# Patient Record
Sex: Female | Born: 1935 | Race: White | Hispanic: No
Health system: Southern US, Community
[De-identification: ages and names within clinical notes are randomized; demographics above are authoritative.]

---

## 2004-06-24 ENCOUNTER — Ambulatory Visit: Payer: Self-pay | Admitting: Unknown Physician Specialty

## 2004-12-24 ENCOUNTER — Ambulatory Visit: Payer: Self-pay | Admitting: Family Medicine

## 2005-06-11 ENCOUNTER — Ambulatory Visit: Payer: Self-pay | Admitting: Gastroenterology

## 2006-03-12 ENCOUNTER — Ambulatory Visit: Payer: Self-pay | Admitting: Family Medicine

## 2007-06-24 ENCOUNTER — Ambulatory Visit: Payer: Self-pay | Admitting: Family Medicine

## 2009-03-30 ENCOUNTER — Inpatient Hospital Stay: Payer: Self-pay | Admitting: Internal Medicine

## 2010-06-23 ENCOUNTER — Inpatient Hospital Stay: Payer: Self-pay | Admitting: Internal Medicine

## 2011-04-17 ENCOUNTER — Inpatient Hospital Stay: Payer: Self-pay | Admitting: Internal Medicine

## 2011-07-30 LAB — COMPREHENSIVE METABOLIC PANEL
Albumin: 3.3 g/dL — ABNORMAL LOW (ref 3.4–5.0)
Alkaline Phosphatase: 72 U/L (ref 50–136)
Anion Gap: 14 (ref 7–16)
Bilirubin,Total: 0.3 mg/dL (ref 0.2–1.0)
Calcium, Total: 8.4 mg/dL — ABNORMAL LOW (ref 8.5–10.1)
Chloride: 104 mmol/L (ref 98–107)
Co2: 23 mmol/L (ref 21–32)
Creatinine: 1.25 mg/dL (ref 0.60–1.30)
EGFR (Non-African Amer.): 44 — ABNORMAL LOW
Osmolality: 289 (ref 275–301)
Potassium: 4.1 mmol/L (ref 3.5–5.1)
SGOT(AST): 13 U/L — ABNORMAL LOW (ref 15–37)
Sodium: 141 mmol/L (ref 136–145)

## 2011-07-30 LAB — CBC
HCT: 34.9 % — ABNORMAL LOW (ref 35.0–47.0)
HGB: 11.4 g/dL — ABNORMAL LOW (ref 12.0–16.0)
MCH: 26.3 pg (ref 26.0–34.0)
MCHC: 32.8 g/dL (ref 32.0–36.0)
MCV: 80 fL (ref 80–100)
RBC: 4.36 10*6/uL (ref 3.80–5.20)
RDW: 16.2 % — ABNORMAL HIGH (ref 11.5–14.5)

## 2011-07-30 LAB — URINALYSIS, COMPLETE
Bacteria: NONE SEEN
Bilirubin,UR: NEGATIVE
Glucose,UR: NEGATIVE mg/dL (ref 0–75)
Ph: 6 (ref 4.5–8.0)
Protein: NEGATIVE
RBC,UR: 2 /HPF (ref 0–5)
Squamous Epithelial: NONE SEEN
WBC UR: 1 /HPF (ref 0–5)

## 2011-07-31 ENCOUNTER — Inpatient Hospital Stay: Payer: Self-pay | Admitting: Internal Medicine

## 2011-07-31 LAB — CK TOTAL AND CKMB (NOT AT ARMC): CK, Total: 25 U/L (ref 21–215)

## 2011-07-31 LAB — TSH: Thyroid Stimulating Horm: 6.28 u[IU]/mL — ABNORMAL HIGH

## 2011-07-31 LAB — RAPID INFLUENZA A&B ANTIGENS

## 2011-07-31 LAB — TROPONIN I: Troponin-I: <0.02 ng/mL

## 2011-08-01 LAB — BASIC METABOLIC PANEL
Anion Gap: 14 (ref 7–16)
Calcium, Total: 7.9 mg/dL — ABNORMAL LOW (ref 8.5–10.1)
Chloride: 108 mmol/L — ABNORMAL HIGH (ref 98–107)
EGFR (Non-African Amer.): 50 — ABNORMAL LOW
Osmolality: 289 (ref 275–301)
Potassium: 3.7 mmol/L (ref 3.5–5.1)
Sodium: 142 mmol/L (ref 136–145)

## 2011-08-01 LAB — URINE CULTURE

## 2011-08-02 LAB — CBC WITH DIFFERENTIAL/PLATELET
Basophil #: 0 10*3/uL (ref 0.0–0.1)
Eosinophil %: 0 %
HCT: 32.4 % — ABNORMAL LOW (ref 35.0–47.0)
HGB: 10.6 g/dL — ABNORMAL LOW (ref 12.0–16.0)
Lymphocyte #: 0.3 10*3/uL — ABNORMAL LOW (ref 1.0–3.6)
Lymphocyte %: 11.7 %
MCH: 26.3 pg (ref 26.0–34.0)
MCV: 80 fL (ref 80–100)
Monocyte %: 9.7 %
Platelet: 107 10*3/uL — ABNORMAL LOW (ref 150–440)
RBC: 4.05 10*6/uL (ref 3.80–5.20)
RDW: 16.6 % — ABNORMAL HIGH (ref 11.5–14.5)

## 2011-08-02 LAB — URINALYSIS, COMPLETE
Bacteria: NONE SEEN
Bilirubin,UR: NEGATIVE
Ketone: NEGATIVE
Ph: 5 (ref 4.5–8.0)
RBC,UR: 1 /HPF (ref 0–5)
Specific Gravity: 1.029 (ref 1.003–1.030)
Squamous Epithelial: NONE SEEN

## 2011-08-02 LAB — BASIC METABOLIC PANEL
Anion Gap: 13 (ref 7–16)
BUN: 25 mg/dL — ABNORMAL HIGH (ref 7–18)
Chloride: 106 mmol/L (ref 98–107)
Co2: 23 mmol/L (ref 21–32)
Creatinine: 1.17 mg/dL (ref 0.60–1.30)
EGFR (Non-African Amer.): 47 — ABNORMAL LOW
Osmolality: 291 (ref 275–301)
Potassium: 3.7 mmol/L (ref 3.5–5.1)
Sodium: 142 mmol/L (ref 136–145)

## 2011-08-03 LAB — CBC WITH DIFFERENTIAL/PLATELET
Basophil #: 0 10*3/uL (ref 0.0–0.1)
Basophil %: 0.3 %
Eosinophil %: 0.6 %
HCT: 33.2 % — ABNORMAL LOW (ref 35.0–47.0)
HGB: 10.9 g/dL — ABNORMAL LOW (ref 12.0–16.0)
Lymphocyte #: 0.7 10*3/uL — ABNORMAL LOW (ref 1.0–3.6)
Lymphocyte %: 18.8 %
MCV: 80 fL (ref 80–100)
Monocyte #: 0.4 10*3/uL (ref 0.0–0.7)
Monocyte %: 10.2 %
Neutrophil #: 2.5 10*3/uL (ref 1.4–6.5)
RBC: 4.15 10*6/uL (ref 3.80–5.20)
RDW: 16.5 % — ABNORMAL HIGH (ref 11.5–14.5)

## 2011-08-04 LAB — BASIC METABOLIC PANEL
BUN: 35 mg/dL — ABNORMAL HIGH (ref 7–18)
Calcium, Total: 7.9 mg/dL — ABNORMAL LOW (ref 8.5–10.1)
Creatinine: 0.97 mg/dL (ref 0.60–1.30)
EGFR (African American): >60
EGFR (Non-African Amer.): 59 — ABNORMAL LOW
Glucose: 121 mg/dL — ABNORMAL HIGH (ref 65–99)
Potassium: 3.8 mmol/L (ref 3.5–5.1)
Sodium: 143 mmol/L (ref 136–145)

## 2011-08-04 LAB — HEMOGLOBIN A1C: Hemoglobin A1C: 6.2 % (ref 4.2–6.3)

## 2011-08-04 LAB — URINE CULTURE

## 2011-08-05 LAB — CULTURE, BLOOD (SINGLE)

## 2011-08-07 LAB — CULTURE, BLOOD (SINGLE)

## 2011-11-06 ENCOUNTER — Inpatient Hospital Stay: Payer: Self-pay | Admitting: Internal Medicine

## 2011-11-06 LAB — COMPREHENSIVE METABOLIC PANEL
Alkaline Phosphatase: 99 U/L (ref 50–136)
Anion Gap: 10 (ref 7–16)
BUN: 21 mg/dL — ABNORMAL HIGH (ref 7–18)
Bilirubin,Total: 0.4 mg/dL (ref 0.2–1.0)
Calcium, Total: 8.7 mg/dL (ref 8.5–10.1)
Chloride: 102 mmol/L (ref 98–107)
Co2: 26 mmol/L (ref 21–32)
EGFR (African American): >60
EGFR (Non-African Amer.): >60
Potassium: 3.9 mmol/L (ref 3.5–5.1)
Sodium: 138 mmol/L (ref 136–145)

## 2011-11-06 LAB — CBC
MCH: 27.7 pg (ref 26.0–34.0)
Platelet: 184 10*3/uL (ref 150–440)
WBC: 9.8 10*3/uL (ref 3.6–11.0)

## 2011-11-06 LAB — URINALYSIS, COMPLETE
Bilirubin,UR: NEGATIVE
Nitrite: NEGATIVE
Protein: NEGATIVE
RBC,UR: 2 /HPF (ref 0–5)
Specific Gravity: 1.018 (ref 1.003–1.030)
Squamous Epithelial: 1

## 2011-11-06 LAB — TROPONIN I: Troponin-I: <0.02 ng/mL

## 2011-11-07 LAB — CBC WITH DIFFERENTIAL/PLATELET
Basophil #: 0 10*3/uL (ref 0.0–0.1)
Basophil %: 0.2 %
Eosinophil #: 0 10*3/uL (ref 0.0–0.7)
Eosinophil %: 0 %
HCT: 36.8 % (ref 35.0–47.0)
HGB: 12.3 g/dL (ref 12.0–16.0)
Lymphocyte %: 10.3 %
MCH: 28.2 pg (ref 26.0–34.0)
MCHC: 33.5 g/dL (ref 32.0–36.0)
MCV: 84 fL (ref 80–100)
Monocyte #: 0.7 x10 3/mm (ref 0.2–0.9)
Neutrophil #: 5.8 10*3/uL (ref 1.4–6.5)
Neutrophil %: 79.7 %
Platelet: 158 10*3/uL (ref 150–440)
RBC: 4.36 10*6/uL (ref 3.80–5.20)
RDW: 17.9 % — ABNORMAL HIGH (ref 11.5–14.5)
WBC: 7.3 10*3/uL (ref 3.6–11.0)

## 2011-11-07 LAB — BASIC METABOLIC PANEL
Anion Gap: 10 (ref 7–16)
BUN: 17 mg/dL (ref 7–18)
Calcium, Total: 8.1 mg/dL — ABNORMAL LOW (ref 8.5–10.1)
Co2: 25 mmol/L (ref 21–32)
Creatinine: 0.86 mg/dL (ref 0.60–1.30)
EGFR (African American): >60
EGFR (Non-African Amer.): >60
Glucose: 116 mg/dL — ABNORMAL HIGH (ref 65–99)
Osmolality: 280 (ref 275–301)
Potassium: 3.7 mmol/L (ref 3.5–5.1)
Sodium: 139 mmol/L (ref 136–145)

## 2011-11-12 LAB — CULTURE, BLOOD (SINGLE)

## 2011-12-03 ENCOUNTER — Observation Stay: Payer: Self-pay | Admitting: Internal Medicine

## 2011-12-03 LAB — COMPREHENSIVE METABOLIC PANEL
Albumin: 3.2 g/dL — ABNORMAL LOW (ref 3.4–5.0)
Alkaline Phosphatase: 90 U/L (ref 50–136)
Anion Gap: 7 (ref 7–16)
BUN: 20 mg/dL — ABNORMAL HIGH (ref 7–18)
Bilirubin,Total: 0.7 mg/dL (ref 0.2–1.0)
Calcium, Total: 8.8 mg/dL (ref 8.5–10.1)
Chloride: 103 mmol/L (ref 98–107)
Co2: 30 mmol/L (ref 21–32)
Creatinine: 1.02 mg/dL (ref 0.60–1.30)
EGFR (African American): >60
EGFR (Non-African Amer.): 52 — ABNORMAL LOW
Glucose: 118 mg/dL — ABNORMAL HIGH (ref 65–99)
Osmolality: 283 (ref 275–301)
Potassium: 4.1 mmol/L (ref 3.5–5.1)
SGOT(AST): 18 U/L (ref 15–37)
SGPT (ALT): 16 U/L
Sodium: 140 mmol/L (ref 136–145)
Total Protein: 7.5 g/dL (ref 6.4–8.2)

## 2011-12-03 LAB — URINALYSIS, COMPLETE
Bacteria: NONE SEEN
Bilirubin,UR: NEGATIVE
Blood: NEGATIVE
Glucose,UR: NEGATIVE mg/dL (ref 0–75)
Ketone: NEGATIVE
Leukocyte Esterase: NEGATIVE
Nitrite: NEGATIVE
Ph: 7 (ref 4.5–8.0)
Protein: NEGATIVE
RBC,UR: 1 /HPF (ref 0–5)
Specific Gravity: 1.023 (ref 1.003–1.030)
Squamous Epithelial: NONE SEEN
WBC UR: <1 /HPF (ref 0–5)

## 2011-12-03 LAB — CBC
HCT: 41.5 % (ref 35.0–47.0)
HGB: 13.7 g/dL (ref 12.0–16.0)
MCH: 28.6 pg (ref 26.0–34.0)
MCHC: 32.9 g/dL (ref 32.0–36.0)
MCV: 87 fL (ref 80–100)
Platelet: 180 10*3/uL (ref 150–440)
RBC: 4.77 10*6/uL (ref 3.80–5.20)
RDW: 18 % — ABNORMAL HIGH (ref 11.5–14.5)

## 2011-12-03 LAB — TSH: Thyroid Stimulating Horm: 2.11 u[IU]/mL

## 2011-12-03 LAB — DIGOXIN LEVEL: Digoxin: 1.09 ng/mL

## 2011-12-04 LAB — BASIC METABOLIC PANEL
Anion Gap: 12 (ref 7–16)
BUN: 24 mg/dL — ABNORMAL HIGH (ref 7–18)
Creatinine: 0.95 mg/dL (ref 0.60–1.30)
EGFR (Non-African Amer.): 57 — ABNORMAL LOW
Glucose: 148 mg/dL — ABNORMAL HIGH (ref 65–99)
Potassium: 4.2 mmol/L (ref 3.5–5.1)
Sodium: 140 mmol/L (ref 136–145)

## 2011-12-04 LAB — CBC WITH DIFFERENTIAL/PLATELET
Eosinophil #: 0.1 10*3/uL (ref 0.0–0.7)
Eosinophil %: 0.6 %
Lymphocyte %: 6.5 %
MCH: 28.8 pg (ref 26.0–34.0)
MCHC: 33.6 g/dL (ref 32.0–36.0)
Monocyte #: 0.8 x10 3/mm (ref 0.2–0.9)
Neutrophil %: 84.4 %
Platelet: 176 10*3/uL (ref 150–440)
RBC: 4.8 10*6/uL (ref 3.80–5.20)
RDW: 18.2 % — ABNORMAL HIGH (ref 11.5–14.5)
WBC: 11.6 10*3/uL — ABNORMAL HIGH (ref 3.6–11.0)

## 2011-12-04 LAB — LIPID PANEL
Cholesterol: 116 mg/dL (ref 0–200)
HDL Cholesterol: 36 mg/dL — ABNORMAL LOW (ref 40–60)
Ldl Cholesterol, Calc: 59 mg/dL (ref 0–100)
Triglycerides: 105 mg/dL (ref 0–200)
VLDL Cholesterol, Calc: 21 mg/dL (ref 5–40)

## 2011-12-05 LAB — CBC WITH DIFFERENTIAL/PLATELET
Basophil #: 0 10*3/uL (ref 0.0–0.1)
Basophil %: 0.3 %
Eosinophil #: 0.1 10*3/uL (ref 0.0–0.7)
Eosinophil %: 0.6 %
HCT: 39.9 % (ref 35.0–47.0)
HGB: 13.5 g/dL (ref 12.0–16.0)
Lymphocyte %: 14.9 %
MCH: 29.3 pg (ref 26.0–34.0)
MCHC: 34 g/dL (ref 32.0–36.0)
Monocyte #: 0.9 x10 3/mm (ref 0.2–0.9)
Neutrophil #: 7.9 10*3/uL — ABNORMAL HIGH (ref 1.4–6.5)
RBC: 4.62 10*6/uL (ref 3.80–5.20)
WBC: 10.4 10*3/uL (ref 3.6–11.0)

## 2011-12-05 LAB — URINE CULTURE

## 2012-11-08 IMAGING — CR DG CHEST 1V PORT
1 series · 1 of 1 positions shown · non-contrast
Comparison: none

REASON FOR EXAM: weakness
COMMENTS:

PROCEDURE:     DXR - DXR PORTABLE CHEST SINGLE VIEW  - December 03, 2011  [DATE]
RESULT:     Comparison: 11/06/2011

[portable]
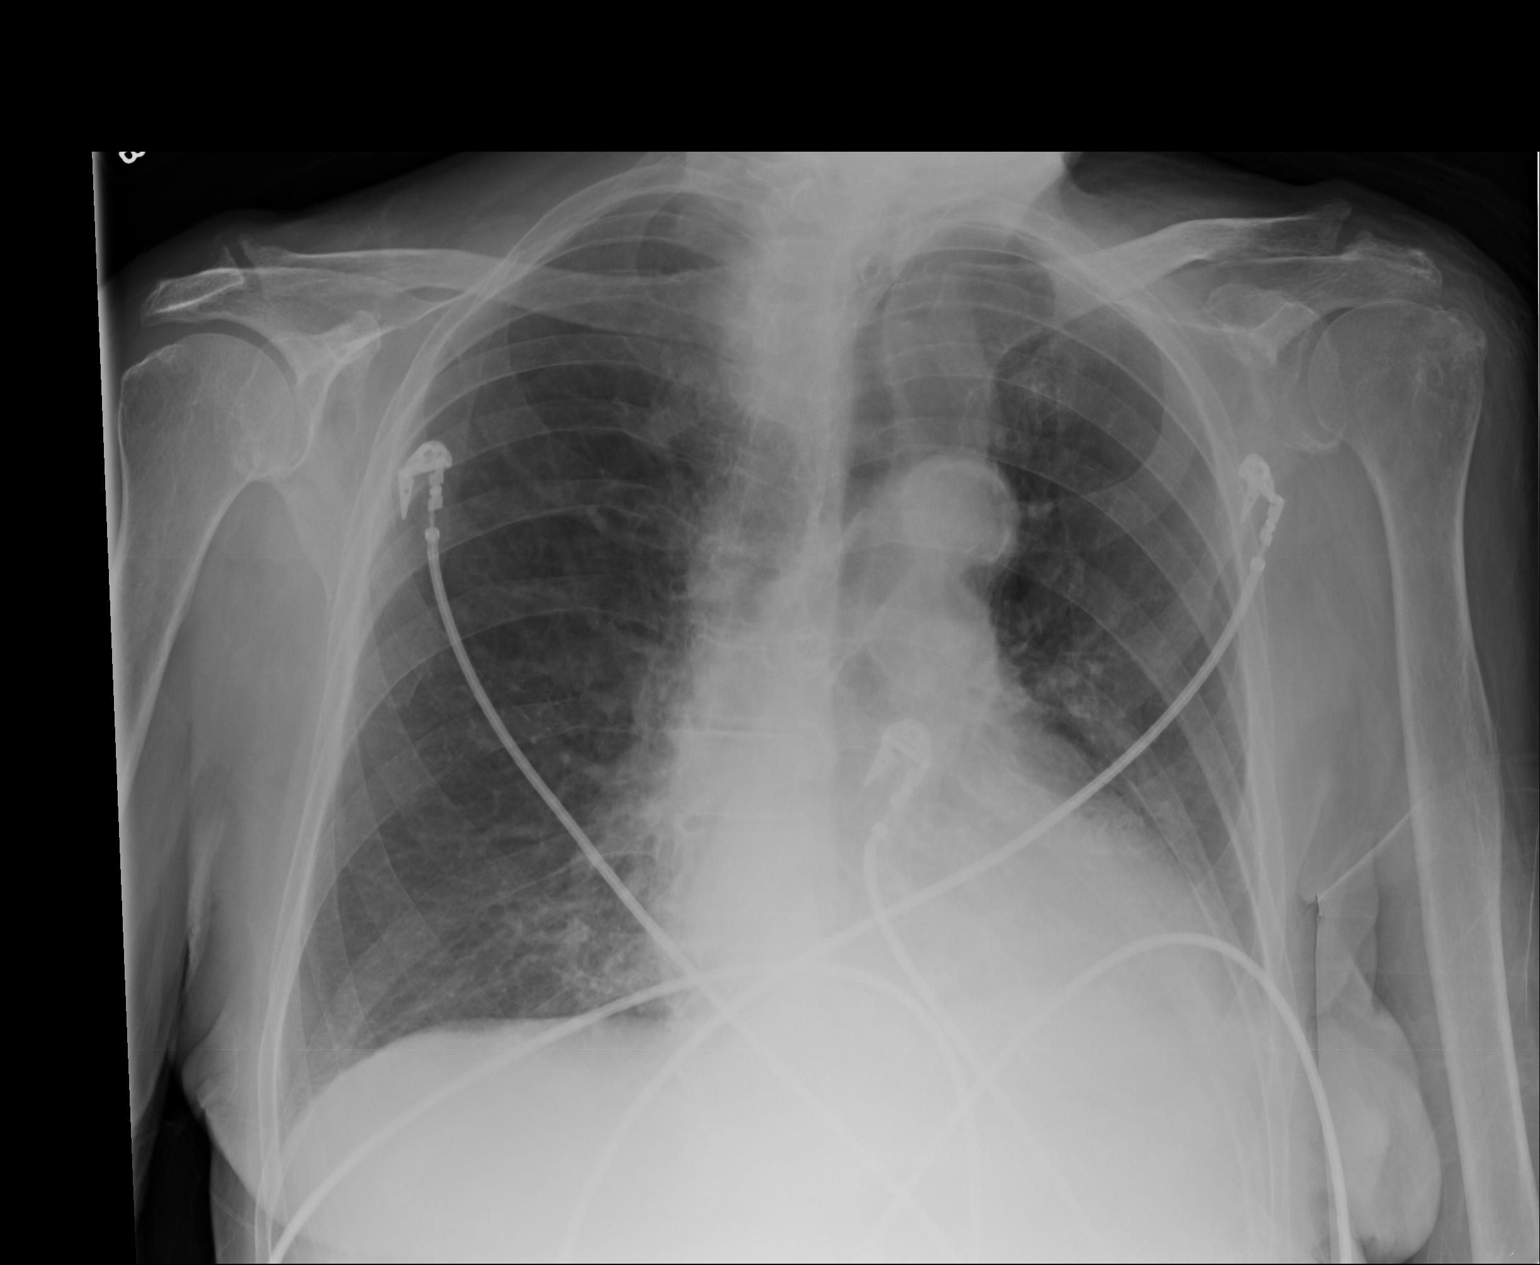

[1 of 1 positions shown; findings below may reference images not displayed]

FINDINGS: The patient is rotated to left. Heart and mediastinum are stable, given
patient rotation. Minimal basilar opacities are likely secondary to
atelectasis.
IMPRESSION: Minimal basilar opacities are likely secondary to atelectasis.

[REDACTED]

## 2012-11-08 IMAGING — CR PELVIS - 1-2 VIEW
1 series · 1 of 1 positions shown · non-contrast
Comparison: none

REASON FOR EXAM: left hip pain
COMMENTS:

PROCEDURE:     DXR - DXR PELVIS AP ONLY  - December 03, 2011  [DATE]
RESULT:     Comparison: None

[t pelvis ap]
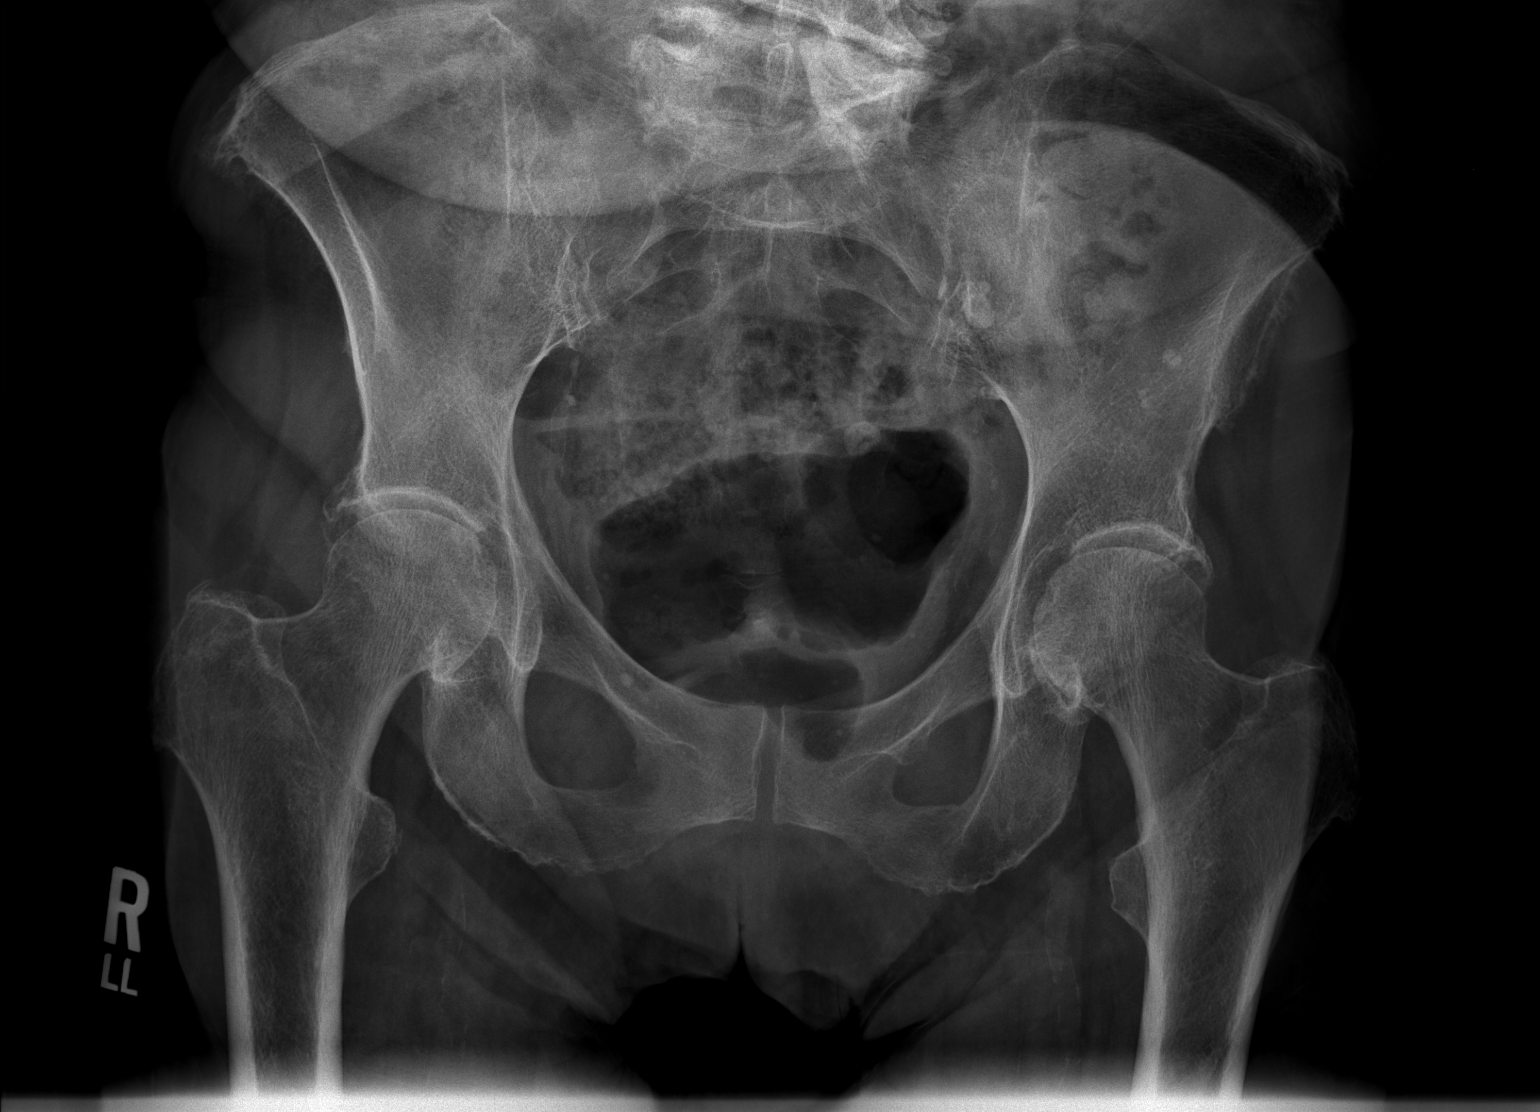

[1 of 1 positions shown; findings below may reference images not displayed]

FINDINGS: AP pelvis demonstrates no fracture or dislocation. There is generalized
osteopenia. There are degenerative changes of the left hip. There is no
right hip fracture. The sacroiliac joints are unremarkable.
IMPRESSION: No acute osseous injury of the pelvis.

## 2012-11-08 IMAGING — CT CT HEAD WITHOUT CONTRAST
2 series · 15 of 30 positions shown, 19 images · non-contrast
Comparison: none

REASON FOR EXAM: CVA?
COMMENTS:

[Series 2: without · axial · non-contrast · 0.46mm/px · z∈[-182,-56]mm · 13 of 31 slices shown, 17 images]
[im 3/31  brain]
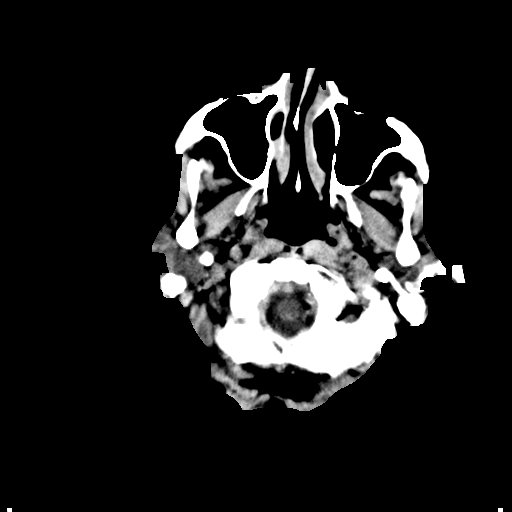
[im 3/31  bone]
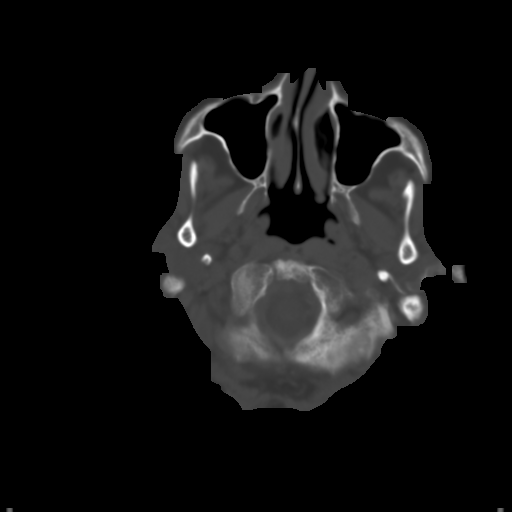
[im 5/31  brain]
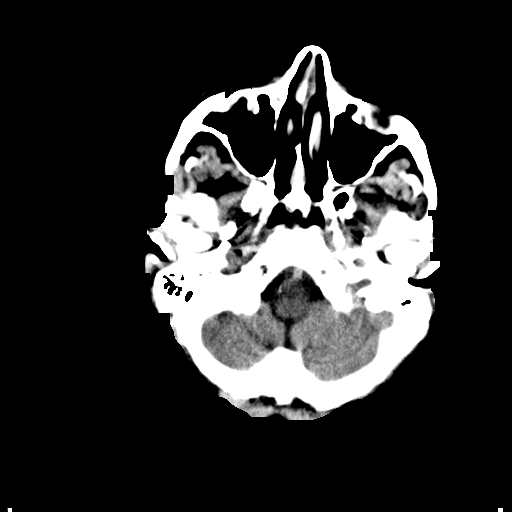
[im 7/31  brain]
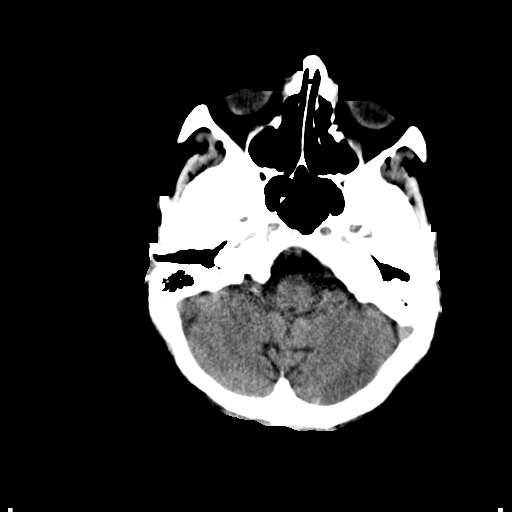
[im 9/31  brain]
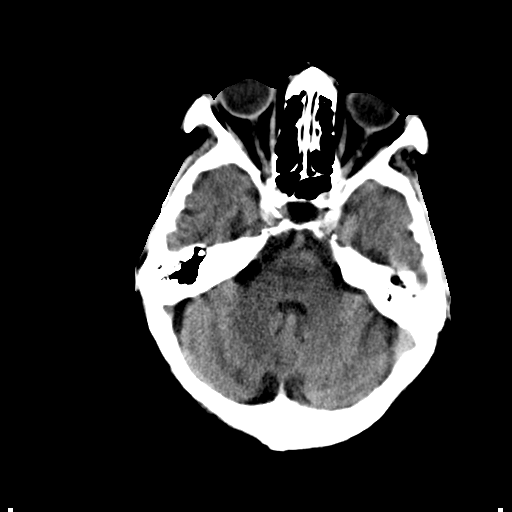
[im 11/31  brain]
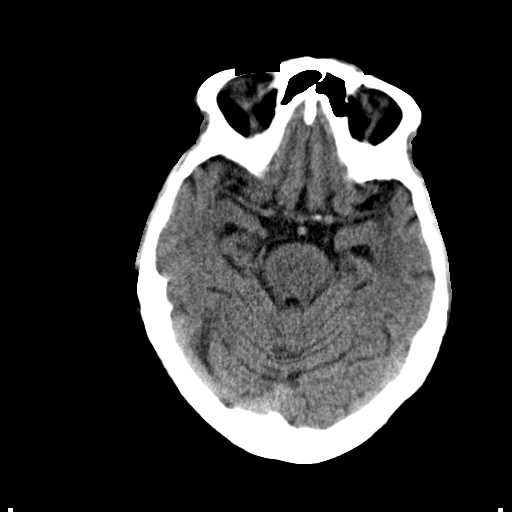
[im 11/31  bone]
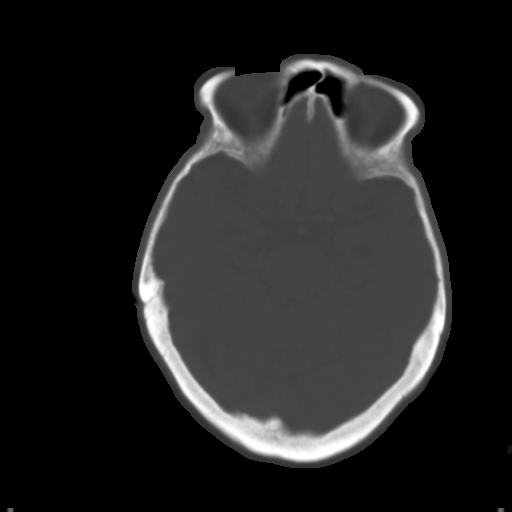
[im 13/31  brain]
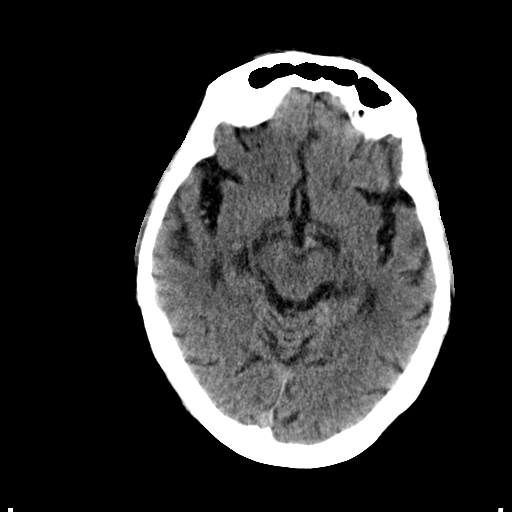
[im 16/31  brain]
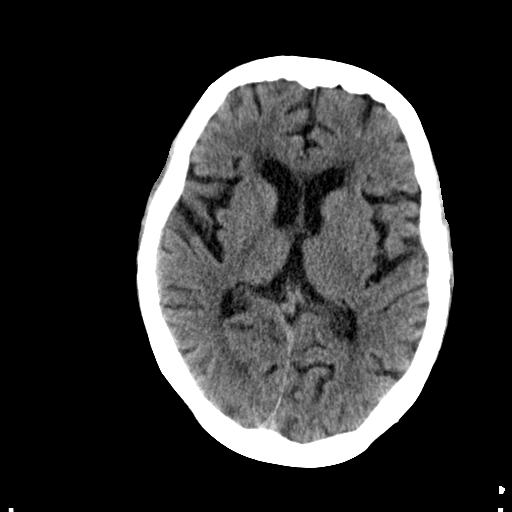
[im 18/31  brain]
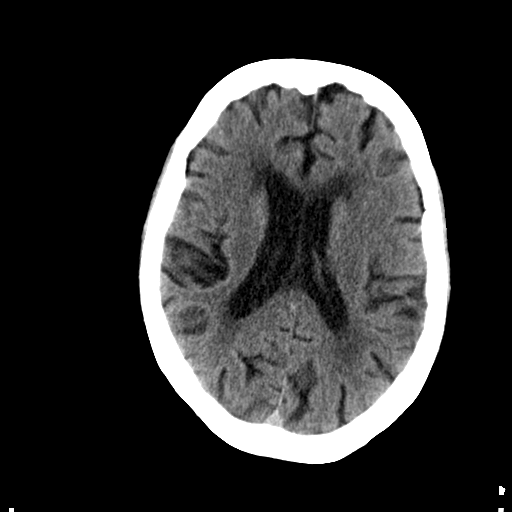
[im 20/31  brain]
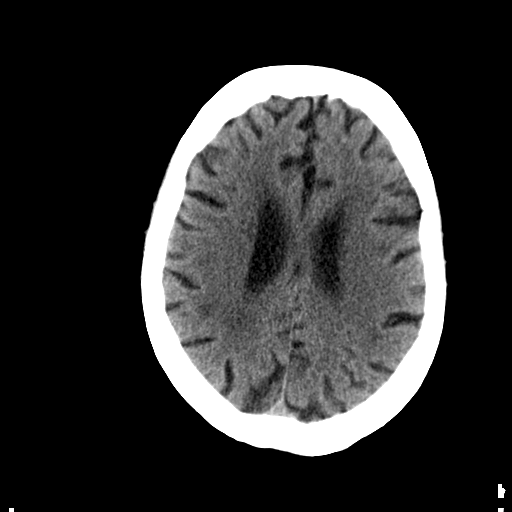
[im 20/31  bone]
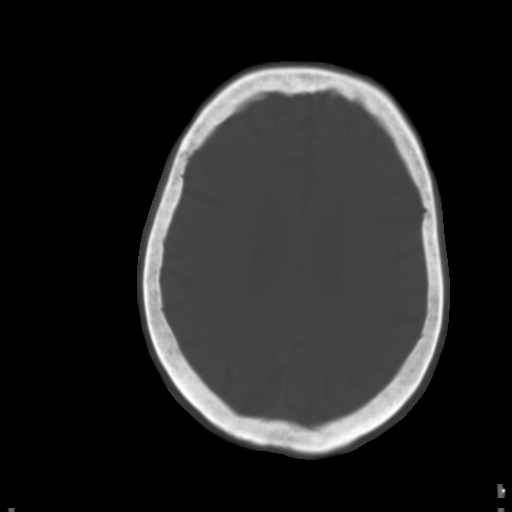
[im 22/31  brain]
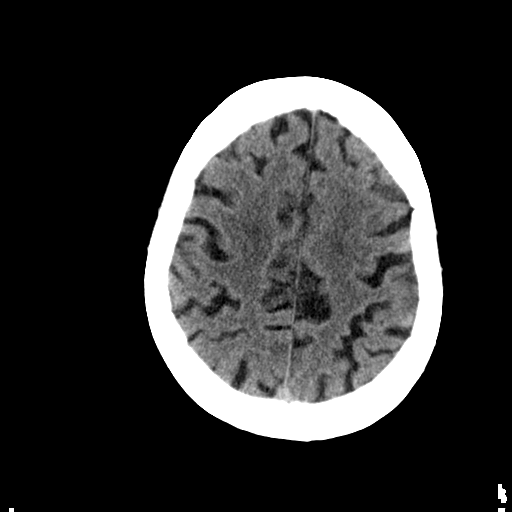
[im 24/31  brain]
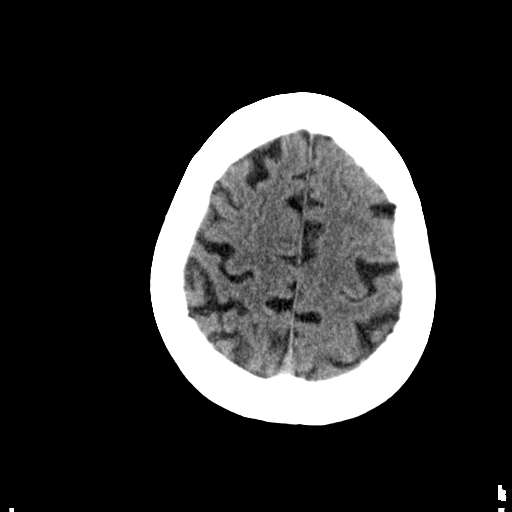
[im 26/31  brain]
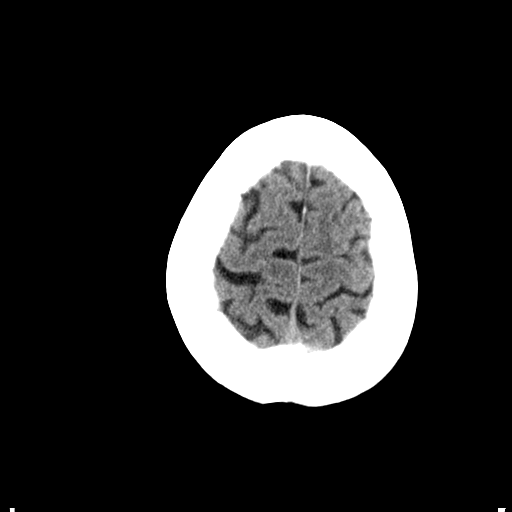
[im 28/31  brain]
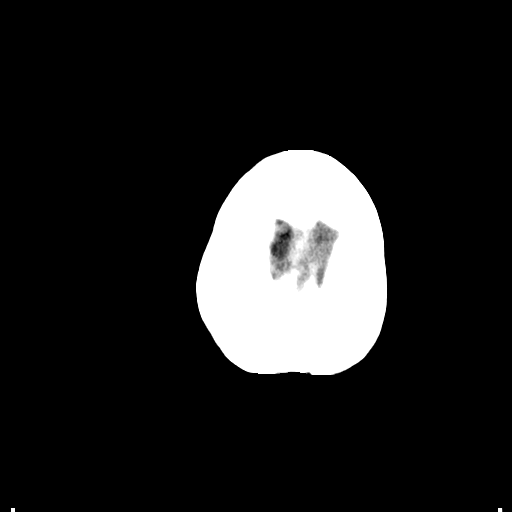
[im 28/31  bone]
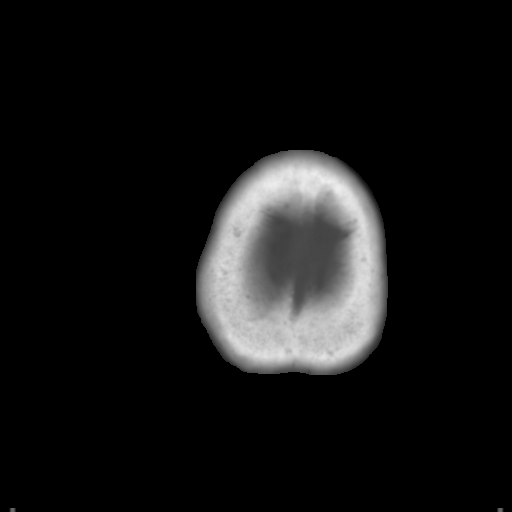

[Series 3: bone · axial · 0.46mm/px · z∈[-182,-162]mm · 2 of 31 slices shown]
[im 3/31  bone]
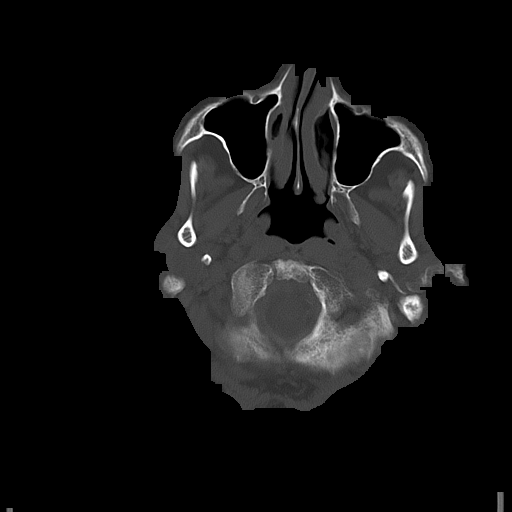
[im 7/31  bone]
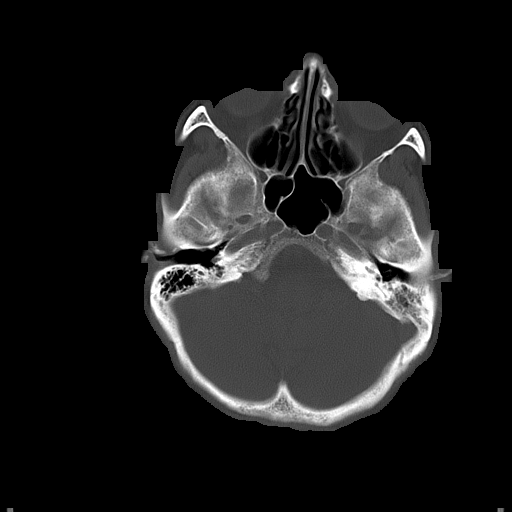

[15 of 30 positions shown; findings below may reference images not displayed]

PROCEDURE:     CT  - CT HEAD WITHOUT CONTRAST  - December 03, 2011  [DATE]

RESULT:     Axial noncontrast CT scanning was performed through the brain
with reconstructions at 5 mm intervals and slice thicknesses. Comparison is
made to the study November 06, 2011.

There is stable moderate diffuse cerebral and cerebellar atrophy with
compensatory ventriculomegaly. This is not out of proportion to patient's
age. There is no intracranial hemorrhage nor intracranial mass effect. There
is no evidence of an evolving ischemic infarction. At bone window settings
the observed portions of the paranasal sinuses and mastoid air cells are
clear. There is no evidence of an acute skull fracture.
IMPRESSION: 1. There is no evidence of an acute ischemic or hemorrhagic infarction or
other intracranial hemorrhage.
2. There is no intracranial mass effect nor hydrocephalus.
3. There are moderate stable age-related atrophic changes.

## 2014-09-17 NOTE — Discharge Summary (Signed)
PATIENT NAME:  Nina JacksonGUDD, Nina H MR#:  [id number] DATE OF BIRTH:  1929-03-16  DATE OF ADMISSION:  11/06/2011 DATE OF DISCHARGE:  11/08/2011   PRIMARY CARE PHYSICIAN: Will be Dr. Terance HartBronstein, discharged to [facility].   PRESENTING COMPLAINT: Weakness, fever and dry cough.   DISCHARGE DIAGNOSES:  1. Generalized weakness with debility.  2. Fever presumed due to acute bronchitis.  3. Hypothyroidism.  4. Hypertension.  5. Dementia. 6. Alzheimer's.   CONDITION ON DISCHARGE: Fair. Vitals stable.   DISCHARGE MEDICATIONS:  1. Magnesium oxide 400 mg p.o. daily.  2. Protonix 40 mg p.o. daily.  3. Synthroid 0.05 mg p.o. daily.  4. Tylenol 650 mg every 6 hours p.r.n.  5. Phenergan 12.5 mg p.o. every 6 hours p.r.n.  6. Aspirin 325 mg p.o. daily.  7. Lisinopril 20 mg daily.  8. Digoxin 0.125 mg p.o. daily.  9. Cardizem CD 240 p.o. daily.  10. Ferrous sulfate 325 mg p.o. with meals.  11. Levaquin 250 mg p.o. daily for 4 more days.   LABORATORY, DIAGNOSTIC AND RADIOLOGICAL DATA:  CBC within normal limits.  Basic metabolic panel within normal limits.  Blood cultures negative in 36 hours.  CT the head: Chronic involutional changes without evidence of acute ischemia.  Chest x-ray shows interstitial prominence which may be present, pulmonary fibrosis versus mild edema. Decreased conspicuity in the patient's known right lower lobe pneumonia.  EKG: Normal sinus rhythm, nonspecific ST-T changes.  Arterial blood gas on admission: PaO2 was 58. Saturations on discharge have been 98% on room air.   BRIEF SUMMARY OF HOSPITAL COURSE: [name] is an 81 year old Caucasian female with history of Alzheimer's dementia and comes into the Emergency Room with:   1. Generalized weakness which was suspected along with mechanical fall at home without trauma: The patient was seen and was admitted. IV fluids were given to her. She was seen by Physical Therapy, who recommends rehabilitation secondary to her weakness.   2. Low-grade fever with normal WBC and no consolidation noted on chest x-ray, along with some dry cough: The patient was started on Levaquin for presumed bronchitis. She remained afebrile. White count was stable. Her saturations are 97% on room air. Blood cultures remained negative.  3. Hypertension: Home medications were continued.  4. Chronic atrial fibrillation: On p.o. digoxin, and calcium channel blocker, and aspirin.  5. Gastroesophageal reflux disease: On PPI.  6. Alzheimer's dementia: Per daughter, the patient is getting worse with memory; and although she is at home most of the time, she is worried about safety in daily activities and other help at home. The daughter is looking for a long-term facility. For now, she will be discharged to [facility] for rehab.   CODE STATUS: The patient remained a NO CODE, DO NOT RESUSCITATE.   TIME SPENT: 40 minutes.  ____________________________ Wylie HailSona A. Allena KatzPatel, MD sap:cbb D: 11/08/2011 10:48:48 ET T: 11/08/2011 10:59:50 ET JOB#: [id number]  cc: Matin Mattioli A. Allena KatzPatel, MD, <Dictator> Teena Iraniavid M. Terance HartBronstein, MD Willow OraSONA A Nadia Torr MD ELECTRONICALLY SIGNED 11/15/2011 15:43

## 2014-09-17 NOTE — H&P (Signed)
PATIENT NAME:  Nina Harrison, Nina Harrison MR#:  [id number] DATE OF BIRTH:  May 09, 1929  DATE OF ADMISSION:  07/31/2011  PRIMARY CARE PHYSICIAN:   Dr. Wonda Cheng.  CHIEF COMPLAINT: Lethargy, falling, altered mental status.   HISTORY OF PRESENT ILLNESS: Nina Harrison is an 81 year old pleasant Caucasian female who lives at home with her daughter. She was in her usual state of health until today. She was found a little congested, but according to her daughter seemed okay until the evening. She was found to be lethargic and barely able to walk using the walker, then her daughter heard the sound of a fall. She found her lying on her back when she fell with mild bruise on her back. After that she got weaker and weaker to the extent that she went to the bed and they are unable to get her out of bed. The patient was not even able to put her pajamas on. She was noticed also to have low grade fever. She had some mild sore throat. The patient was brought to the Emergency Department for evaluation where she was found to have low-grade fever, but work-up was negative including CT scan of the head and chest x-ray. The patient was admitted for evaluation of her altered mental status and her generalized fatigue and tendency to falling.   REVIEW OF SYSTEMS: CONSTITUTIONAL: She reports low-grade fever and chills, no sweating. She admits having generalized fatigue. EYES: No blurring of vision. No double vision. ENT: Admits having mild sore throat. She has chronic slight impairment of hearing. No dysphagia. No dizziness. CARDIOVASCULAR: No chest pain. No shortness of breath. No edema. No syncope although she fell. RESPIRATORY: No shortness of breath. No cough. No chest pain. GASTROINTESTINAL: No abdominal pain. No nausea, no vomiting. No diarrhea. GENITOURINARY: No dysuria or frequency of urination. MUSCULOSKELETAL: No joint swelling or pain. No muscular pain or swelling. INTEGUMENTARY: No skin rash. No ulcers. NEUROLOGIC: No focal weakness. No  seizure activity. No headache. PSYCHIATRY: No anxiety. No depression. ENDOCRINE: No heat or cold intolerance. No night sweats.   PAST MEDICAL HISTORY:  1. History of Alzheimer dementia which is mild but progressing.  2. History of paroxysmal atrial fibrillation maintained on anticoagulation using Pradaxa.  3. History of systemic hypertension.  4. History of hyperlipidemia.  5. History of hypothyroidism.  6. History of gastroesophageal reflux disease.  7. History of chronic renal insufficiency.  8. History of mild depression.  9. Remote history of right Bell's palsy.   PAST SURGICAL HISTORY:  1. History of appendectomy.  2. Hysterectomy.  3. Nissen fundoplication.   SOCIAL HABITS: Nonsmoker. No history of alcohol or drug abuse.   SOCIAL HISTORY: She is widowed. Lives at home with her daughter.   FAMILY HISTORY: Negative for Alzheimer's dementia. She has eight brothers and sisters. None of them have dementia. Her father died at the age of 55 from myocardial infarction.   ADMISSION MEDICATIONS:  1. Protonix 40 mg a day.  2. Magnesium oxide 400 mg once a day.  3. Pradaxa 150 mg twice a day. 4. Lisinopril 20 mg once a day.  5. Cardizem CD 180 mg once a day. 6. Levothyroxine 25 mcg once a day. 7. Centrum Silver Multivitamin once a day.   ALLERGIES: Celebrex.   PHYSICAL EXAMINATION:  VITAL SIGNS: Blood pressure 156/74, respiratory rate 18, pulse 85, temperature 99.2, repeat was 100, oxygen saturation 94%.   GENERAL APPEARANCE: Elderly lady, thin looking, lying in bed, sleepy but arousable, pleasant and cooperative. She is  in no acute distress.   HEAD/NECK: No pallor. No icterus. No cyanosis.   ENT: Hearing was slightly impaired. Nasal mucosa, lips, and tongue were normal.   EYES: Normal iris and conjunctivae. Pupils about 3 to 4 mm, equal and sluggishly reactive to light. Her right eyelid is slightly down compared to the left, possibly from her old Bell's palsy.   NECK: Supple.  Trachea at midline. No thyromegaly. No cervical lymphadenopathy. No masses.   HEART: Irregular S1, S2. No S3 or S4. No murmur. No carotid bruits.   RESPIRATORY: Normal breathing pattern without use of accessory muscles. No rales. No wheezing.   ABDOMEN: Soft without tenderness. No hepatosplenomegaly. No masses. No hernias.   SKIN: No ulcers. No subcutaneous nodules.   MUSCULOSKELETAL: No joint swelling. No clubbing.   NEUROLOGIC: Cranial nerves II through XII are intact. There is a mild septal right facial droop primarily in the right eyelid from previous Bell's palsy. No focal motor weakness.   PSYCHIATRIC: The patient is alert, oriented to place and people, but not time. She could not remember the name of the President of the [place] nor his wife, but able to name her daughter and her granddaughter who are sitting beside her.   LABORATORY, RADIOLOGICAL AND DIAGNOSTIC DATA: CT scan of the head without contrast showed no acute intracranial process. However, there was generalized cerebral atrophy. Chest x-ray showed no consolidation. No effusion. Heart size was upper normal. Serum glucose 154, BUN 26, creatinine 1.25, sodium 141, potassium 4.1. Liver function tests were normal. Slight reduction of albumin at 3.3. Total CPK 25. Troponin less than 0.02. CBC showed white count of 8,500, hemoglobin 11, hematocrit 34, platelet count 180. Influenza rapid test was negative. Urinalysis was unremarkable. EKG showed normal sinus rhythm at rate of 86 per minute, unremarkable EKG except for nonspecific T abnormalities.   ASSESSMENT:  1. Altered mental status associated with generalized weakness and status post fall. Likely with the associated low-grade fever that she has viral prodrome syndrome.  2. Fatigue or generalized weakness leading to inability to walk. She had one fall.  3. Paroxysmal atrial fibrillation. Although her EKG showed normal sinus rhythm, however, during examination, her rhythm  noticed to be irregular and rapid. Over the monitor her heart rate was irregular reaching 130 to 140 per minute, was consistent with atrial fibrillation, then spontaneously converted to sinus rhythm again. When she is in sinus rhythm, the rate is controlled in the 80s and 90s.  4. Alzheimer's dementia, which seems progressing. 5. Systemic hypertension.  6. History of hypothyroidism.  7. History of hyperlipidemia.  8. History of gastroesophageal reflux disease.  9. History of right-sided Bell's palsy.  10. History of appendectomy. 11. Hysterectomy. 12. Nissen fundoplication.   PLAN: We will admit the patient to medical floor on telemetry monitoring. Neurochecks every four hours. Monitor for recurrence of atrial fibrillation and to monitor her heart rate if it is uncontrolled. I will continue her home medications including the diltiazem. However, I will add digoxin 0.25 mg a day. Alternative to that is to add beta blocker if digitoxin fails. Continue anticoagulation using Pradaxa. I will check her TSH. We will consult physical therapy to assess her ambulation and her tendency to fall. We will see their recommendation. Gentle IV hydration with normal saline. For deep vein thrombosis prophylaxis, the patient is already on anticoagulation with Pradaxa.   TIME NEEDED TO EVALUATE THIS PATIENT: More than 50 minutes.   ____________________________ Carney Corners. Rudene Re, MD amd:ap D: 07/31/2011  01:50:06 ET T: 07/31/2011 08:34:14 ET JOB#: [id number]  cc: Carney CornersAmir M. Rudene Rearwish, MD, <Dictator> Durward MallardJoel B. Marguerite OleaMoffett, MD  Karolee OhsAMIR Dala DockM Claramae Rigdon MD ELECTRONICALLY SIGNED 08/02/2011 1:43

## 2014-09-17 NOTE — Consult Note (Signed)
Brief Consult Note: Diagnosis: PAF, in setting of acute bronchitis. Increased risk of falling.   Patient was seen by consultant.   Consult note dictated.   Comments: REC  Agree with current therapy, dc Pradaxa, start ASA, family agrees.  Electronic Signatures: Marcina MillardParaschos, Scotti Kosta (MD)  (Signed 08-Mar-13 13:32)  Authored: Brief Consult Note   Last Updated: 08-Mar-13 13:32 by Marcina MillardParaschos, Julane Crock (MD)

## 2014-09-17 NOTE — Consult Note (Signed)
PATIENT NAME:  Nina Harrison, Nina Harrison MR#:  [id number] DATE OF BIRTH:  November 09, 1928  DATE OF CONSULTATION:  08/01/2011  REFERRING PHYSICIAN:   CONSULTING PHYSICIAN:  Marcina MillardAlexander Jenifer Struve, MD  PRIMARY CARE PHYSICIAN: Dr. Marguerite OleaMoffett  CHIEF COMPLAINT: Decreased mental status.   HISTORY OF PRESENT ILLNESS: Patient is an 81 year old female with history of paroxysmal atrial fibrillation. She apparently was in her usual state of health until recently when she was noted to have increase in congestion which progressed to decreased p.o. intake and lethargy. The patient apparently fell and was found by her daughter lying on her back. She had difficulty getting up. She was also noted to have low-grade fever. She was brought to [facility] Emergency Room and admitted to telemetry. Patient has been predominantly in sinus rhythm but does have episodes of intermittent paroxysmal atrial fibrillation. Patient has had a history of paroxysmal atrial fibrillation, currently on Pradaxa. Patient is asymptomatic, denies chest pain or palpitations.   PAST MEDICAL HISTORY:  1. Paroxysmal atrial fibrillation.  2. Hypertension.  3. Hyperlipidemia.  4. Hypothyroidism.  5. Gastroesophageal reflux disease.  6. Chronic renal insufficiency.   MEDICATIONS ON ADMISSION:  1. Pradaxa 150 mg b.i.d.  2. Lisinopril 10 mg daily.   3. Diltiazem HCl 180 mg daily.  4. Pantoprazole 40 mg daily.   SOCIAL HISTORY: Patient is a widow. She has three children. She currently lives with her daughter and her daughter's husband. She denies tobacco abuse.   FAMILY HISTORY: Father died status post myocardial infarction at age 79.   REVIEW OF SYSTEMS: CONSTITUTIONAL: Patient denies fever or chills. EYES: Patient denies vision loss. EARS: Patient denies hearing loss. RESPIRATORY: Patient does have shortness of breath with nonproductive cough. CARDIOVASCULAR: Patient denies chest pain. GASTROINTESTINAL: Patient denies nausea, vomiting,  diarrhea, constipation. GENITOURINARY: Patient denies history of hematuria. ENDOCRINE: Patient denies polyuria, polydipsia. MUSCULOSKELETAL: Patient denies arthralgias, myalgias. NEUROLOGICAL: Patient denies focal muscle weakness or numbness. PSYCHOLOGICAL: Patient denies depression or anxiety.   PHYSICAL EXAMINATION:  VITAL SIGNS: Blood pressure 120/50, pulse 105, respirations 24, temperature 97.3, pulse oximetry 93% on 2 liters.   HEENT: Pupils equal, reactive to light and accommodation.   NECK: Supple without thyromegaly.   LUNGS: Diffuse scattered rhonchi.   CARDIOVASCULAR: Normal jugular venous pressure. Normal point of maximal impulse. Regular rate, rhythm. Normal S1, S2. No appreciable gallop, murmur, rub.   ABDOMEN: Soft, nontender without hepatosplenomegaly.   EXTREMITIES: There is no cyanosis, clubbing, edema. Pulses were intact bilaterally.   MUSCULOSKELETAL: Normal muscle tone.   NEUROLOGIC: Patient is alert and oriented x3. Motor and sensory both grossly intact.   IMPRESSION: 81 year old female who presents with decreased mental status with baseline dementia, decreased p.o. intake, possible acute bronchitis or viral syndrome with paroxysmal atrial fibrillation though patient is asymptomatic.   RECOMMENDATIONS:  1. Agree with overall current therapy.  2. Agree with increased dose of Cardizem CD 240 mg daily.  3. In light of patient's recent episodes of falling would discontinue Pradaxa.  4. Start aspirin 325 mg daily.   ____________________________ Marcina MillardAlexander Zak Gondek, MD ap:cms D: 08/01/2011 13:37:08 ET T: 08/01/2011 14:21:21 ET JOB#: [id number]  cc: Marcina MillardAlexander Darren Caldron, MD, <Dictator>  Marcina MillardALEXANDER Cannan Beeck MD ELECTRONICALLY SIGNED 08/15/2011 8:48

## 2014-09-17 NOTE — H&P (Signed)
PATIENT NAME:  Nina Harrison, Nina Harrison MR#:  [id number] DATE OF BIRTH:  [birth year]  DATE OF ADMISSION:  11/06/2011  PRIMARY CARE PHYSICIAN: Dr. Marguerite Olea REFERRING PHYSICIAN: Dr. Buford Dresser  CHIEF COMPLAINT: Fall and fever today.   HISTORY OF PRESENT ILLNESS: 81 year old Caucasian female with history of dementia, paroxysmal atrial fibrillation, right middle lobe pneumonia, hypertension, hypothyroidism, urinary retention, was sent to ED due to fall today. Patient is demented and could not provide any information. According to her daughter patient has been fine until this morning when she had sneezing and fell on the floor. She was noted to have fever and fatigue. In addition patient has cough and mild shortness of breath. She was sent to ED for further evaluation and was found pneumonia, treated with antibiotics.   PAST MEDICAL HISTORY: As mentioned above: 1. Pneumonia. 2. Atrial fibrillation.  3. Hypertension. 4. Hypothyroidism. 5. Urinary retention. 6. Alzheimer's dementia.  7. Hyperlipidemia.  8. Gastroesophageal reflux disease. 9. Chronic kidney disease.    PAST SURGICAL HISTORY:  1. Appendectomy.  2. Hysterectomy. 3. Nissen fundoplication.   SOCIAL HISTORY: No smoking, alcohol drinking or illicit drugs. Living with her daughter.   FAMILY HISTORY: Father died of heart attack.   ALLERGIES: Celebrex.    HOME MEDICATIONS:  1. Aspirin 325 mg p.o. daily. 2. Centrum Silver Ultra Women's oral tablet 1 tablet p.o. daily. 3. Digoxin 125 mcg p.o. daily.  4. Diltiazem 240 mg p.o. daily. 5. Ferrous sulfate 325 mg p.o. daily.  6. Levothyroxine 50 mcg p.o. daily.  7. Lisinopril 20 mg p.o. daily.  8. Magnesium sulfate 400 mg p.o. daily. 9. Pantoprazole 40 mg p.o. daily.   REVIEW OF SYSTEMS: Patient is demented, could not provide review of systems. She denies any symptoms except fatigue.   PHYSICAL EXAMINATION:  VITAL SIGNS: Temperature 100.4, blood pressure 174/68, pulse 68, respirations 20, oxygen  saturation 93% on room air.   GENERAL: Patient is alert, awake, oriented in no acute distress.   HEENT: Pupils are round, equal, reactive to light and accommodation. Moist oral mucosa. Clear oropharynx.   NECK: Supple. No JVD or carotid bruit. No lymphadenopathy. No thyromegaly.   CARDIOVASCULAR: S1, S2 regular rate, rhythm. No murmurs, gallops.   PULMONARY: Bilateral air entry. No wheezing, rales.   ABDOMEN: Soft. No distention. No tenderness. No organomegaly. There is big hernia in the middle of the abdomen.   EXTREMITIES: No edema, clubbing, or cyanosis. No calf tenderness. Strong bilateral pedal pulses.   NEUROLOGY: Alert and oriented x3. No focal deficit. Power 5/5. Sensation intact but patient is demented.  LABORATORY, DIAGNOSTIC AND RADIOLOGICAL DATA: CAT scan of head: Chronic changes. No evidence of acute abnormality. ABG showed pH 7.49, pCO2 34, pO2 58. WBC 9.8, hemoglobin 12.2, platelets 184, glucose 140, BUN 21, creatinine 0.85. Electrolytes are normal. EKG showed normal sinus rhythm at 69 beats per minute.   IMPRESSION:  1. Pneumonia.  2. Acute respiratory failure with hypoxia.  3. Dehydration, mild.  4. Uncontrolled hypertension.   PLAN OF TREATMENT:  1. Patient will be admitted to medical floor. Will continue Levaquin IV. Follow up CBC, blood culture, sputum culture.  2. For mild for dehydration, will give half-normal saline IV fluid and follow up BMP.  3. Will get fall precautions, physical therapy evaluation.  4. Continue hypertension medication and aspirin.   Discussed patient's situation with the patient's daughter.   TIME SPENT: About 60 minutes.   ____________________________ Shaune Pollack, MD qc:cms D: 11/06/2011 17:54:40 ET T: 11/06/2011 21:18:42 ET JOB#: [id number]  cc: Shaune PollackQing Emmy Keng, MD, <Dictator> Durward MallardJoel B. Marguerite OleaMoffett, MD Shaune PollackQING Brooklin Rieger MD ELECTRONICALLY SIGNED 11/07/2011 19:50

## 2014-09-17 NOTE — H&P (Signed)
PATIENT NAME:  Nina Harrison, Nina Harrison MR#:  [id number] DATE OF BIRTH:  02-27-1929  DATE OF ADMISSION:  12/03/2011  PRIMARY CARE PHYSICIAN: Dorothey Basemanavid Bronstein, MD   HISTORY OF PRESENT ILLNESS: The patient is an 81 year old Caucasian female with past medical history significant for history of paroxysmal atrial fibrillation, history of severe fatigue and generalized weakness, admission for generalized weakness as well as debility in June 2013 after which the patient was sent to [facility] for rehabilitation. She presented to the hospital with inability to walk. Apparently  the patient was walking yesterday going to the bathroom with her walker, and while she was walking with a walker the patient and her daughter heard a loud pop. Initially they thought it was a pop somewhere in the house; however, the patient stated that it was somewhere in her joint. She denied any significant discomfort in the joint and she kept walking; however, later on she was not able to move much, and she was complaining of discomfort in the left hip as well as possibly lower back. She tried to walk, however, she was unable to do so. She was moaning and crying, and she was brought to the hospital for further evaluation. In the Emergency Room, she tried to walk with a walker; however, her hip was somewhat hitched up, the left hip was hitched up, and she was kind of leaning backwards and was very weak and was not able to walk. Now here back on the stretcher she denies any significant pain or discomfort and does not remember if she had any pain at home or here in the Emergency Room and is not able to provide any history because of Alzheimer's dementia she has. Hospitalist Services were contacted for admission.   PAST MEDICAL HISTORY: Significant for history of admission in 2013 for generalized weakness, debility. At that time, the patient was sent to [facility]. She also had fever which was felt to be due to bronchitis, a history of admission  in March 2013 for altered mental status which was felt to be due to metabolic encephalopathy, dehydration, right middle lobe pneumonia, paroxysmal atrial fibrillation, hypertension, history of hypothyroidism-poorly controlled, urinary retention.  The patient has history of Alzheimer's dementia, progressing, paroxysmal atrial fibrillation, initially with maintained on Pradaxa, now recently Pradaxa was discontinued  by Dr. Darrold JunkerParaschos and was changed to a full dose of aspirin approximately six months ago, history of systemic hypertension, hyperlipidemia, gastroesophageal reflux disease, chronic renal insufficiency, mild depression, history of right Bell's palsy. Last Friday, which was 3 to 4 days ago, the patient had what the patient's family describes as a small transient ischemic attack lasting approximately 10 minutes, presenting itself with confusion as well as slurring of speech. The patient was evaluated by [facility] a few days later and continued on current medications, aspirin, and no further evaluation was performed at this point.   MEDICATIONS: According to medical records, the patient is on: 1. Cardizem CD 240 mg p.o. daily.  2. Centrum Silver once daily. 3. Ecotrin 325 mg p.o. daily. Iron sulfate 325 mg p.o. daily. 4. Lanoxin 125 mcg p.o. daily.  5. Lisinopril 20 mg p.o. twice daily.  6. Magnesium oxide 400 mg p.o. daily.  7. Pantoprazole 40 mg p.o. twice daily. 8. Synthroid 50 mcg p.o. daily.   PAST SURGICAL HISTORY:  1. Appendectomy. 2. Hysterectomy. 3. Nissen fundoplication.   SOCIAL HISTORY: Nonsmoker. No history of alcohol or drug abuse. She is widowed, lives at home with her daughter.  FAMILY HISTORY: Negative for Alzheimer dementia. The patient has 8 brothers as well as sisters. None of them have dementia. The patient's father died at age of 58 from MI.Marland Kitchen   REVIEW OF SYSTEMS: Difficult to obtain as the patient is demented does not remember much of her problems. She  denies any significant trouble such as cough or shortness of breath or chest pains, denies any abdominal discomfort, nausea, vomiting, diarrhea, constipation, denies any urination troubles.   PHYSICAL EXAMINATION:  VITAL SIGNS: On arrival to the hospital, temperature was 97.8, pulse 74, respirations 16, blood pressure 193/82, saturation 98% on room air.   GENERAL: This is a well-nourished, pale Caucasian female in no significant distress lying on the stretcher.   HEENT: Pupils are equal and reactive to light. Extraocular movements are intact.  No icterus or conjunctivitis. Has normal hearing. She does have weakness of the right side of her face including her forehead. She has normal hearing.  NECK: Supple, no masses, nontender. No adenopathy. No JVD or carotid bruits bilaterally. Full range of motion.   LUNGS: Clear to auscultation in all fields. No rales, rhonchi, diminished breath sounds or wheezing. No labored inspirations, increased effort, dullness to percussion, or overt respiratory distress.   CARDIOVASCULAR: S1, S2 appreciated. No murmurs, rubs or gallops noted. PMI is nonlateralized. Chest is nontender to palpation. Rhythm was regular good. Good pedal pulses 2+, and no lower extremity edema, calf tenderness, or cyanosis noted.   ABDOMEN: Soft, nontender. Bowel sounds present. No hepatosplenomegaly or masses were noted. Of note, the patient's pelvic palpation also was nontender.   RECTAL: Deferred.   MUSCULOSKELETAL: Muscle strength: The patient was able to move all extremities, 4 out of 5 in upper as well as lower extremities. I did not notice any significant lateralized weakness. She is able to move freely her hips, and passive movements are also not painful. Straight leg raising was also not painful, and no pain in the back was elicited. No cyanosis was noted. Degenerative joint disease was noted.   SKIN: Skin did not reveal any rashes, lesions, erythema, nodularity, or induration. It  was warm and dry to palpation.   ADENOPATHY: No adenopathy in the cervical region.   NEUROLOGICAL: Cranial nerves are grossly intact, however, as mentioned above, the patient does have weakness on the right side of her face which  involves all her forehead. Sensory is intact. No significant dysarthria or aphasia was noted. The patient is alert, oriented to person as well as place. She is cooperative. Memory is significantly impaired. She is not confused.   LABORATORY, DIAGNOSTIC AND RADIOLOGICAL DATA: BMP showed elevated BUN of 20, glucose 118, otherwise unremarkable. The patient's liver enzymes: Albumin level of 3.2, otherwise unremarkable. White blood cell count is elevated to 12.7, hemoglobin 13.7, platelet count 180, otherwise unremarkable. Other labs are pending. Pelvis AP x-ray 12/03/2011: No acute process or injury of pelvis. Left hip complete x-ray 12/03/2011: No acute osseous injury of left hip. Lumbar spine AP and lateral 12/03/2011: No acute osseous abnormality of lumbar spine, according to the radiologist. EKG is not done.   ASSESSMENT/PLAN:  1. Abnormal gait of unclear etiology at this time: Rule out osseous or ligamentous injury. X-ray seemed to be normal. We will get Physical Therapy involved. We will start the patient on pain medications if she needs, but we will need also to rule out stroke as the patient recently had episodes of transient ischemic attack if she does have unilateral weakness.  I did not notice  any unilateral weakness, however, it may present itself better whenever she walks around with Physical therapy. We will get a CT scan of her head.  2. Malignant hypertension: We will resume the patient's blood pressure medications.  3. History of paroxysmal atrial fibrillation: Seems to be in sinus rhythm. We will get EKG. We will get also digoxin level. We will continue the patient's Cardizem as well as digoxin, as well as aspirin. The patient is off Pradaxa due to history of  falls.  4. Leukocytosis of unclear etiology at this time: We will get chest x-ray as well as urinalysis and cultures to rule out infection.  5. History of gastroesophageal reflux disease: We will continue proton pump inhibitors.  6. History of hyperlipidemia: I will check the patient's lipid profile. In view of history of transient ischemic attack versus stroke, the patient may benefit from lipid-lowering medications, especially if her LDL is more than 100. We will consider that and will initiate Lipitor if needed.   TIME SPENT: One hour.   ____________________________ Katharina Caper, MD rv:cbb D: 12/03/2011 17:03:27 ET T: 12/03/2011 17:29:32 ET JOB#: [id number]  cc: Katharina Caper, MD, <Dictator> Teena Irani. Terance Hart, MD Katharina Caper MD ELECTRONICALLY SIGNED 12/14/2011 18:38

## 2014-09-17 NOTE — Discharge Summary (Signed)
PATIENT NAME:  Nina Harrison, Nina Harrison MR#:  [id number] DATE OF BIRTH:  01-13-1929  DATE OF ADMISSION:  12/03/2011 DATE OF DISCHARGE:  12/05/2011  ADMITTING DIAGNOSIS: Status post fall, difficulty with ambulation.  DISCHARGE DIAGNOSES:  1. Status post fall due to abnormal gait.  2. Abnormal gait likely related to severe osteoarthritis, age-related dysmotility. The patient will need skilled nursing facility.  3. Severe osteoarthritis based on physical examination as well as x-rays.  4. Back pain at home, however no pain while in the hospital. No evidence of fracture on x-rays.  5. History of paroxysmal atrial fibrillation.  6. Hypertension.  7. Hypothyroidism.  8. Alzheimer's dementia.  9. History of paroxysmal atrial fibrillation. 10. Leukocytosis of unclear etiology, now resolved.  11. Recurrent falls.  12. Status post appendectomy.  13. Status post hysterectomy.  14. Status post Nissan fundoplication.  15. Possible recent transient ischemic attack, negative carotid Doppler's here. Could be cardiac in nature due to the patient's history of paroxysmal atrial fibrillation. However, she is not deemed an anticoagulation candidate due to her recurrent falls and a very high risk for bleeding.   CONSULTANTS:  1. Case Manager. 2. Physical Therapy.  PERTINENT LABS AND EVALUATIONS: Admitting glucose 118, BUN 20, creatinine 1.02, sodium 140, potassium 4.1, chloride 103, CO2 30, and calcium 8.8. LFTs were normal except albumin of 3.2. WBC was 12.7, hemoglobin 13.7, and platelet count 180. Subsequent WBC count on 12/05/2011 was 10.4.   Urine culture showed no growth.   CT scan of the head without contrast showed no evidence of acute ischemic changes. Moderate age-related atrophic changes.   Chest x-ray showed minimal basilar opacities secondary to atelectasis.   Lumbar spine showed no osseous abnormality.   X-ray of the left hip showed no injury or fracture.   Pelvis x-ray showed no acute  abnormality.   Ultrasound carotid Doppler's was negative for obstruction or significant stenosis.   HOSPITAL COURSE: Please refer to the history and physical done by the admitting physician. The patient is an 81 year old Caucasian female with multiple medical problems who lives with her daughter who was admitted with severe fatigue and generalized weakness. The patient had recently been in [facility] for rehab. Prior to that she was admitted with inability to walk. The patient again at home had a fall. She was complaining of having pain in her hip and was also having some back pain. Due to this we were asked to admit the patient. The patient was evaluated in the ED. There was no evidence of fractures. By the time she got here she had no further symptoms. She has fallen multiple times and the family was concerned about her well being. She was seen by physical therapy and case manager and was recommended for skilled nursing facility. At this time arrangements are made for discharge to a skilled nursing facility.   DISCHARGE MEDICATIONS:  1. Magnesium oxide 400 mg 1 tab p.o. daily.  2. Protonix 40 mg daily.  3. Ecotrin 325 mg p.o. daily.  4. Lanoxin 125 mcg daily.  5. Cardizem CD 240 mg daily.  6. Iron sulfate 325 mg 1 tab p.o. daily with meals.  7. Synthroid 50 mcg daily.  8. Lisinopril 20 mg 1 tab p.o. twice a day. 9. Centrum Silver 1 tab daily.  10. Tylenol 650 mg every four hours p.r.n. for pain or temperature greater than 100.4. 11. Atorvastatin 10 mg at bedtime.  12. Nystatin topically to the groin area as needed three times daily. 13. Advil  200 mg p.o. every 8 hours p.r.n. pain.   DIET: Low sodium.   ACTIVITY: As tolerated with assistance.  REFERRAL: Skilled nursing facility rehab.   DISCHARGE FOLLOWUP: Followup in 2 to 4 weeks with primary MD, Dr. Wonda ChengJoel Moffett.   TIME SPENT: 35 minutes. ___________________________ Lacie ScottsShreyang H. Allena KatzPatel, MD shp:slb D: 12/05/2011 13:04:21  ET T: 12/05/2011 13:13:42 ET JOB#: [id number]  cc: Yesena Reaves H. Allena KatzPatel, MD, <Dictator> Durward MallardJoel B. Marguerite OleaMoffett, MD Charise CarwinSHREYANG H Orson Rho MD ELECTRONICALLY SIGNED 12/05/2011 17:53

## 2014-09-17 NOTE — Discharge Summary (Signed)
PATIENT NAME:  Nina Harrison, Nina Harrison MR#:  [id number] DATE OF BIRTH:  [birth year]  DATE OF ADMISSION:  07/31/2011 DATE OF DISCHARGE:  08/04/2011   ADMITTING DIAGNOSES:  1. Altered mental status.  2. Fatigue and generalized weakness.  3. Paroxysmal atrial fibrillation.   DISCHARGE DIAGNOSES:  1. Altered mental status, likely metabolic encephalopathy, resolved.  2. Dehydration. 3. Right middle lobe pneumonia.  4. Paroxysmal atrial fibrillation. 5. Hypertension. 6. Hypothyroidism, poor control of hypothyroidism.  7. Urinary retention.   DISCHARGE CONDITION: Stable.   DISCHARGE MEDICATIONS: The patient is to resume her outpatient medications.  1. Levothyroxine 25 mcg p.o. daily.  2. Magnesium oxide 400 mg p.o. daily.  3. Lisinopril 20 mg p.o. daily.  4. Centrum Performance tablet once daily.   ADDITIONAL MEDICATIONS:  1. Digoxin 0.25 mg p.o. daily.  2. Robitussin 10 mL every four hours as needed.  3. Aspirin 325 mg p.o. daily.  4. Diltiazem CD 240 mg p.o. daily.  5. Nystatin topical powder one application to affected areas twice daily.  6. Levaquin 750 mg p.o. daily for three more days to complete a five day course.   HOME OXYGEN: None.   DIET: 2 gram salt, mechanical soft, low fat, low cholesterol.   ACTIVITY LIMITATIONS: As tolerated.   REFERRAL: Physical therapy 2 to 7 times a week.   FOLLOW-UP: Follow-up appointment with Dr. Marguerite OleaMoffett in two days after discharge as well as Dr. Gwen PoundsKowalski in two days after discharge.    CONSULTANTS:  1. Care Management   2. Dr. Darrold JunkerParaschos   RADIOLOGIC STUDIES: 1. Chest, PA and lateral, 07/30/2011 showed no acute disease of chest.  2. Repeat chest x-ray 08/01/2011 showed hazy airspace disease in right lower lobe, difficult to assess secondary to overlying artifact, possible pneumonia. 3. Left hip complete x-ray 08/01/2011 no acute osseous injury of left hip. 4. Pelvic AP x-ray 08/01/2011 no acute osseous injury of pelvis. 5. Chest, PA and lateral,  repeated on 08/02/2011 revealed areas of pneumonitis and superior segment of right lower lobe, medial basal segment of right lower lobe, and possibly left lower lobe. There are also findings concerning for right middle lobe disease. Continued surveillance and evaluation is recommended. Differential considerations are multilobar infectious pneumonitis etiology such as asymmetric edema cannot be excluded if clinically appropriate. Surveillance evaluation is recommended status post appropriate therapeutic regimen.  6. CT of head without contrast 07/30/2011 revealed no acute intracranial process.  7. MRI of brain without contrast 08/04/2011 showed white matter changes consistent with chronic ischemia, diffuse atrophy present, otherwise no acute disease was present.  REASON FOR ADMISSION: The patient is an 81 year old Caucasian female with past medical history significant for history of Alzheimer's dementia and paroxysmal atrial fibrillation who was using Pradaxa presented to the hospital with complaints of falls, lethargy, as well as altered mental status. Please refer to Dr. Riley Nearingarwish's admission note on 07/31/2011.   PHYSICAL EXAMINATION: On arrival to the hospital, the patient's blood pressure was 156/74, temperature was 99.2, pulse 85, respiratory rate was 18, and her oxygen saturation was 94% on room air. Physical exam revealed right facial weakness consistent with her old Bell's palsy.  LABORATORY DATA: Her EKG showed normal sinus rhythm at 86 beats per minute, no acute ST-T changes were noted.   The patient's lab data taken on 07/30/2011 showed glucose 156, elevated BUN to 26, otherwise unremarkable BMP. The patient's liver enzymes showed albumin level of 3.3, otherwise unremarkable liver enzymes. Cardiac enzymes, first set as well as subsequent two more sets,  were within normal limits. TSH was high at 6.28. White blood cell count was normal at 8.5, hemoglobin 11.4, platelets 180. Blood cultures x2 did  not show any growth. Influenza test was also negative. Urine culture on 07/30/2011 as well as repeat on 08/02/2011 showed no growth. Urinalysis was unremarkable.   HOSPITAL COURSE: The patient was admitted to the hospital. She was started on antibiotic therapy because of fever as well as altered mental status and concerns about urinary tract infection initially. However, when repeated chest x-ray came back as positive for right middle lobe pneumonia she was started on antibiotic therapy targeting specifically pneumonia organisms. With this conservative therapy she improved and she physically became somewhat stronger. She was seen by physical therapist while she was in the hospital who recommended rehabilitation placement. Her mental status also improved significantly as well as her fever subsided. The patient is to continue antibiotics for three more days to complete course for pneumonia.   In regards to altered mental status, it was felt to be likely metabolic encephalopathy due to pneumonia and it resolved.   The patient was also felt to have some dehydration. She was given IV fluids and her condition improved. She was able to eat, however, her oral intake initially was poor. She was taking just a few bites of food, however, and if she is not picking up with her intake she may need to be evaluated by Palliative Care.   In regards to paroxysmal atrial fibrillation, the patient was evaluated by Dr. Darrold Junker who recommended to discontinue the patient's Pradaxa due to fall risk and continue aspirin therapy and continue medications for rate control. The patient is to follow-up with Dr. Gwen Pounds as outpatient for further recommendations.   The patient was noted to have urinary retention with approximately 450 mL of urine in her bladder. She had Foley catheter placed on 08/02/2011. Urine cultures as well as blood cultures were taken at the same time, however, failed to show any urinary tract infection. The  patient's Foley catheter will be discontinued upon discharge from the hospital, however, it is recommended to periodically check the patient's bladder content for possible urinary retention and have urodynamic studies done as outpatient in Urology office if needed if she does not improve.   For candidiasis, the patient is to continue nystatin topical powder as needed.   DISPOSITION: The patient is being discharged in stable condition with the above-mentioned medications and follow-up.   TIME SPENT: 40 minutes.   ____________________________ Katharina Caper, MD rv:drc D: 08/04/2011 14:39:23 ET T: 08/04/2011 15:07:01 ET JOB#: [id number]  cc: Katharina Caper, MD, <Dictator> Durward Mallard. Marguerite Olea, MD Lamar Blinks, MD Katelyn Broadnax MD ELECTRONICALLY SIGNED 08/16/2011 10:02

## 2014-09-17 NOTE — Discharge Summary (Signed)
PATIENT NAME:  Nina Harrison, Nina Harrison MR#:  [id number] DATE OF BIRTH:  04/08/1929  DATE OF ADMISSION:  07/31/2011 DATE OF DISCHARGE:  08/04/2011  ADDENDUM:  The patient will be also getting oxygen through nasal cannula at 2 liters of oxygen through nasal cannula 24/7. She is to have also portable tank of oxygen to carry with her whenever she moves around. She is to have oxygen weaned off to room air as tolerated. Her discharge oxygenation is 93 to 96% on 2 liters of oxygen through nasal cannula at rest.   ____________________________ Katharina Caperima Drucella Karbowski, MD rv:drc D: 08/04/2011 15:08:16 ET T: 08/04/2011 15:20:04 ET JOB#: [id number]  cc: Katharina Caperima Iliyah Bui, MD, <Dictator> Durward MallardJoel B. Marguerite OleaMoffett, MD Lamar BlinksBruce J. Kowalski, MD Garrie Woodin MD ELECTRONICALLY SIGNED 08/16/2011 10:02

## 2015-04-10 DIAGNOSIS — I480 Paroxysmal atrial fibrillation: Secondary | ICD-10-CM | POA: Diagnosis not present

## 2015-04-10 DIAGNOSIS — F39 Unspecified mood [affective] disorder: Secondary | ICD-10-CM

## 2015-04-10 DIAGNOSIS — N183 Chronic kidney disease, stage 3 (moderate): Secondary | ICD-10-CM | POA: Diagnosis not present

## 2015-04-10 DIAGNOSIS — I10 Essential (primary) hypertension: Secondary | ICD-10-CM | POA: Diagnosis not present

## 2015-04-10 DIAGNOSIS — K219 Gastro-esophageal reflux disease without esophagitis: Secondary | ICD-10-CM | POA: Diagnosis not present

## 2015-04-10 DIAGNOSIS — G301 Alzheimer's disease with late onset: Secondary | ICD-10-CM

## 2015-05-17 DIAGNOSIS — G301 Alzheimer's disease with late onset: Secondary | ICD-10-CM | POA: Diagnosis not present

## 2015-05-17 DIAGNOSIS — I480 Paroxysmal atrial fibrillation: Secondary | ICD-10-CM | POA: Diagnosis not present

## 2015-05-17 DIAGNOSIS — N183 Chronic kidney disease, stage 3 (moderate): Secondary | ICD-10-CM | POA: Diagnosis not present

## 2015-05-17 DIAGNOSIS — F39 Unspecified mood [affective] disorder: Secondary | ICD-10-CM | POA: Diagnosis not present

## 2015-06-21 DIAGNOSIS — N183 Chronic kidney disease, stage 3 (moderate): Secondary | ICD-10-CM | POA: Diagnosis not present

## 2015-06-21 DIAGNOSIS — G301 Alzheimer's disease with late onset: Secondary | ICD-10-CM

## 2015-06-21 DIAGNOSIS — F39 Unspecified mood [affective] disorder: Secondary | ICD-10-CM | POA: Diagnosis not present

## 2015-06-21 DIAGNOSIS — I480 Paroxysmal atrial fibrillation: Secondary | ICD-10-CM | POA: Diagnosis not present

## 2015-07-18 DIAGNOSIS — G309 Alzheimer's disease, unspecified: Secondary | ICD-10-CM

## 2015-07-18 DIAGNOSIS — E039 Hypothyroidism, unspecified: Secondary | ICD-10-CM | POA: Diagnosis not present

## 2015-07-18 DIAGNOSIS — I10 Essential (primary) hypertension: Secondary | ICD-10-CM

## 2015-07-18 DIAGNOSIS — N183 Chronic kidney disease, stage 3 (moderate): Secondary | ICD-10-CM

## 2015-07-18 DIAGNOSIS — F39 Unspecified mood [affective] disorder: Secondary | ICD-10-CM

## 2015-07-18 DIAGNOSIS — I4891 Unspecified atrial fibrillation: Secondary | ICD-10-CM

## 2015-07-18 DIAGNOSIS — K219 Gastro-esophageal reflux disease without esophagitis: Secondary | ICD-10-CM

## 2015-09-05 DIAGNOSIS — R21 Rash and other nonspecific skin eruption: Secondary | ICD-10-CM | POA: Diagnosis not present

## 2015-09-18 DIAGNOSIS — I480 Paroxysmal atrial fibrillation: Secondary | ICD-10-CM | POA: Diagnosis not present

## 2015-09-18 DIAGNOSIS — K219 Gastro-esophageal reflux disease without esophagitis: Secondary | ICD-10-CM

## 2015-09-18 DIAGNOSIS — G301 Alzheimer's disease with late onset: Secondary | ICD-10-CM | POA: Diagnosis not present

## 2015-09-18 DIAGNOSIS — F39 Unspecified mood [affective] disorder: Secondary | ICD-10-CM

## 2015-11-13 DIAGNOSIS — B372 Candidiasis of skin and nail: Secondary | ICD-10-CM

## 2015-11-14 DIAGNOSIS — I4891 Unspecified atrial fibrillation: Secondary | ICD-10-CM | POA: Diagnosis not present

## 2015-11-14 DIAGNOSIS — G309 Alzheimer's disease, unspecified: Secondary | ICD-10-CM | POA: Diagnosis not present

## 2015-11-14 DIAGNOSIS — F39 Unspecified mood [affective] disorder: Secondary | ICD-10-CM

## 2015-11-14 DIAGNOSIS — N183 Chronic kidney disease, stage 3 (moderate): Secondary | ICD-10-CM

## 2015-11-14 DIAGNOSIS — E039 Hypothyroidism, unspecified: Secondary | ICD-10-CM

## 2015-11-14 DIAGNOSIS — I10 Essential (primary) hypertension: Secondary | ICD-10-CM | POA: Diagnosis not present

## 2015-11-14 DIAGNOSIS — B351 Tinea unguium: Secondary | ICD-10-CM | POA: Diagnosis not present

## 2015-11-14 DIAGNOSIS — K219 Gastro-esophageal reflux disease without esophagitis: Secondary | ICD-10-CM | POA: Diagnosis not present

## 2016-01-10 DIAGNOSIS — N183 Chronic kidney disease, stage 3 (moderate): Secondary | ICD-10-CM

## 2016-01-10 DIAGNOSIS — F39 Unspecified mood [affective] disorder: Secondary | ICD-10-CM

## 2016-01-10 DIAGNOSIS — I480 Paroxysmal atrial fibrillation: Secondary | ICD-10-CM | POA: Diagnosis not present

## 2016-01-10 DIAGNOSIS — G301 Alzheimer's disease with late onset: Secondary | ICD-10-CM

## 2016-01-10 DIAGNOSIS — K219 Gastro-esophageal reflux disease without esophagitis: Secondary | ICD-10-CM

## 2016-01-21 DIAGNOSIS — R21 Rash and other nonspecific skin eruption: Secondary | ICD-10-CM | POA: Diagnosis not present

## 2016-02-26 DIAGNOSIS — S62502A Fracture of unspecified phalanx of left thumb, initial encounter for closed fracture: Secondary | ICD-10-CM | POA: Diagnosis not present

## 2016-03-14 DIAGNOSIS — G309 Alzheimer's disease, unspecified: Secondary | ICD-10-CM

## 2016-03-14 DIAGNOSIS — K219 Gastro-esophageal reflux disease without esophagitis: Secondary | ICD-10-CM

## 2016-03-14 DIAGNOSIS — I4891 Unspecified atrial fibrillation: Secondary | ICD-10-CM

## 2016-03-14 DIAGNOSIS — N183 Chronic kidney disease, stage 3 (moderate): Secondary | ICD-10-CM

## 2016-03-14 DIAGNOSIS — F39 Unspecified mood [affective] disorder: Secondary | ICD-10-CM

## 2016-03-14 DIAGNOSIS — E039 Hypothyroidism, unspecified: Secondary | ICD-10-CM

## 2016-03-14 DIAGNOSIS — I10 Essential (primary) hypertension: Secondary | ICD-10-CM

## 2016-03-28 DIAGNOSIS — S31829A Unspecified open wound of left buttock, initial encounter: Secondary | ICD-10-CM

## 2016-05-22 DIAGNOSIS — I480 Paroxysmal atrial fibrillation: Secondary | ICD-10-CM | POA: Diagnosis not present

## 2016-05-22 DIAGNOSIS — F39 Unspecified mood [affective] disorder: Secondary | ICD-10-CM

## 2016-05-22 DIAGNOSIS — G301 Alzheimer's disease with late onset: Secondary | ICD-10-CM | POA: Diagnosis not present

## 2016-05-22 DIAGNOSIS — N183 Chronic kidney disease, stage 3 (moderate): Secondary | ICD-10-CM

## 2016-05-22 DIAGNOSIS — K219 Gastro-esophageal reflux disease without esophagitis: Secondary | ICD-10-CM

## 2016-07-08 DIAGNOSIS — L03317 Cellulitis of buttock: Secondary | ICD-10-CM | POA: Diagnosis not present

## 2016-07-18 DIAGNOSIS — I10 Essential (primary) hypertension: Secondary | ICD-10-CM | POA: Diagnosis not present

## 2016-07-18 DIAGNOSIS — I4891 Unspecified atrial fibrillation: Secondary | ICD-10-CM

## 2016-07-18 DIAGNOSIS — K219 Gastro-esophageal reflux disease without esophagitis: Secondary | ICD-10-CM

## 2016-07-18 DIAGNOSIS — G309 Alzheimer's disease, unspecified: Secondary | ICD-10-CM

## 2016-07-18 DIAGNOSIS — F323 Major depressive disorder, single episode, severe with psychotic features: Secondary | ICD-10-CM | POA: Diagnosis not present

## 2016-07-18 DIAGNOSIS — E039 Hypothyroidism, unspecified: Secondary | ICD-10-CM | POA: Diagnosis not present

## 2016-07-18 DIAGNOSIS — N183 Chronic kidney disease, stage 3 (moderate): Secondary | ICD-10-CM

## 2016-09-02 DIAGNOSIS — R410 Disorientation, unspecified: Secondary | ICD-10-CM | POA: Diagnosis not present

## 2016-11-14 DIAGNOSIS — G309 Alzheimer's disease, unspecified: Secondary | ICD-10-CM

## 2016-11-14 DIAGNOSIS — E039 Hypothyroidism, unspecified: Secondary | ICD-10-CM | POA: Diagnosis not present

## 2016-11-14 DIAGNOSIS — N183 Chronic kidney disease, stage 3 (moderate): Secondary | ICD-10-CM

## 2016-11-14 DIAGNOSIS — I6529 Occlusion and stenosis of unspecified carotid artery: Secondary | ICD-10-CM | POA: Diagnosis not present

## 2016-11-14 DIAGNOSIS — F39 Unspecified mood [affective] disorder: Secondary | ICD-10-CM | POA: Diagnosis not present

## 2016-11-14 DIAGNOSIS — K219 Gastro-esophageal reflux disease without esophagitis: Secondary | ICD-10-CM

## 2016-11-14 DIAGNOSIS — I4891 Unspecified atrial fibrillation: Secondary | ICD-10-CM

## 2016-11-21 DIAGNOSIS — L309 Dermatitis, unspecified: Secondary | ICD-10-CM | POA: Diagnosis not present

## 2016-12-30 DIAGNOSIS — L03114 Cellulitis of left upper limb: Secondary | ICD-10-CM | POA: Diagnosis not present

## 2017-01-15 DIAGNOSIS — G301 Alzheimer's disease with late onset: Secondary | ICD-10-CM | POA: Diagnosis not present

## 2017-01-15 DIAGNOSIS — I480 Paroxysmal atrial fibrillation: Secondary | ICD-10-CM | POA: Diagnosis not present

## 2017-01-15 DIAGNOSIS — I10 Essential (primary) hypertension: Secondary | ICD-10-CM | POA: Diagnosis not present

## 2017-01-15 DIAGNOSIS — N183 Chronic kidney disease, stage 3 (moderate): Secondary | ICD-10-CM

## 2017-01-15 DIAGNOSIS — F39 Unspecified mood [affective] disorder: Secondary | ICD-10-CM | POA: Diagnosis not present

## 2017-02-03 DIAGNOSIS — I959 Hypotension, unspecified: Secondary | ICD-10-CM | POA: Diagnosis not present

## 2017-03-18 DIAGNOSIS — F39 Unspecified mood [affective] disorder: Secondary | ICD-10-CM | POA: Diagnosis not present

## 2017-03-18 DIAGNOSIS — I10 Essential (primary) hypertension: Secondary | ICD-10-CM | POA: Diagnosis not present

## 2017-03-18 DIAGNOSIS — E039 Hypothyroidism, unspecified: Secondary | ICD-10-CM | POA: Diagnosis not present

## 2017-03-18 DIAGNOSIS — N183 Chronic kidney disease, stage 3 (moderate): Secondary | ICD-10-CM | POA: Diagnosis not present

## 2017-03-18 DIAGNOSIS — G309 Alzheimer's disease, unspecified: Secondary | ICD-10-CM | POA: Diagnosis not present

## 2017-03-18 DIAGNOSIS — I4891 Unspecified atrial fibrillation: Secondary | ICD-10-CM | POA: Diagnosis not present

## 2017-04-27 ENCOUNTER — Other Ambulatory Visit: Payer: Self-pay | Admitting: Internal Medicine

## 2017-05-14 DIAGNOSIS — E441 Mild protein-calorie malnutrition: Secondary | ICD-10-CM | POA: Diagnosis not present

## 2017-05-15 DIAGNOSIS — H6121 Impacted cerumen, right ear: Secondary | ICD-10-CM | POA: Diagnosis not present

## 2017-05-31 ENCOUNTER — Emergency Department
Admission: EM | Admit: 2017-05-31 | Discharge: 2017-06-01 | Disposition: A | Discharge status: Skilled Nursing Facility | Payer: Medicare Other | Attending: Student in an Organized Health Care Education/Training Program | Admitting: Student in an Organized Health Care Education/Training Program

## 2017-05-31 ENCOUNTER — Other Ambulatory Visit: Payer: Self-pay

## 2017-05-31 ENCOUNTER — Emergency Department: Payer: Medicare Other

## 2017-05-31 DIAGNOSIS — Y999 Unspecified external cause status: Secondary | ICD-10-CM | POA: Diagnosis not present

## 2017-05-31 DIAGNOSIS — S0990XA Unspecified injury of head, initial encounter: Secondary | ICD-10-CM

## 2017-05-31 DIAGNOSIS — W07XXXA Fall from chair, initial encounter: Secondary | ICD-10-CM | POA: Diagnosis not present

## 2017-05-31 DIAGNOSIS — Y92121 Bathroom in nursing home as the place of occurrence of the external cause: Secondary | ICD-10-CM | POA: Diagnosis not present

## 2017-05-31 DIAGNOSIS — Y9389 Activity, other specified: Secondary | ICD-10-CM | POA: Diagnosis not present

## 2017-05-31 DIAGNOSIS — F0390 Unspecified dementia without behavioral disturbance: Secondary | ICD-10-CM | POA: Insufficient documentation

## 2017-05-31 DIAGNOSIS — S0083XA Contusion of other part of head, initial encounter: Secondary | ICD-10-CM | POA: Diagnosis not present

## 2017-05-31 DIAGNOSIS — Z66 Do not resuscitate: Secondary | ICD-10-CM | POA: Insufficient documentation

## 2017-05-31 NOTE — ED Notes (Signed)
Patient transported to CT via stretcher with CT tech. °

## 2017-05-31 NOTE — ED Triage Notes (Signed)
Ems reports pt was in hoyer lift and fell asleep hitting her head. Bruising to forehead.

## 2017-05-31 NOTE — ED Notes (Signed)
Pt family to bedside and updated on plan of care. Family understanding.

## 2017-05-31 NOTE — Discharge Instructions (Signed)
Follow up with PCP.  Return for any questions or concerns. 

## 2017-05-31 NOTE — ED Provider Notes (Signed)
[facility] Emergency Department Provider Note    First MD Initiated Contact with Patient 05/31/17 2105     (approximate)  I have reviewed the triage vital signs and the nursing notes.   HISTORY  Chief Complaint Head Injury  Level v Caveat: dementia  HPI Nina Harrison is a 84 y.o. female with a history of advanced dementia presents after what sounds to be mechanical fall out of a lift chair in the bathroom.  She did fall hit her left forehead.  No report of LOC.  Denies any pain.  No chest pain or shortness of breath.  No numbness or tingling.  She does take a daily aspirin.   No past medical history on file. No family history on file.  There are no active problems to display for this patient.     Prior to Admission medications   Not on File    Allergies Celebrex [celecoxib]    Social History Social History   Tobacco Use  . Smoking status: Not on file  Substance Use Topics  . Alcohol use: Not on file  . Drug use: Not on file    Review of Systems Patient denies headaches, rhinorrhea, blurry vision, numbness, shortness of breath, chest pain, edema, cough, abdominal pain, nausea, vomiting, diarrhea, dysuria, fevers, rashes or hallucinations unless otherwise stated above in HPI. ____________________________________________   PHYSICAL EXAM:  VITAL SIGNS: Vitals:   05/31/17 2112 05/31/17 2230  BP: (!) 147/80 (!) 115/52  Pulse: 93 87  Resp: (!) 22 (!) 22  Temp: 98.3 F (36.8 C)   SpO2: 98% 95%    Constitutional: Alert pleasantly demented and in no acute distress. Eyes: Conjunctivae are normal.  Head:  Contusion to left forehead Nose: No congestion/rhinnorhea. Mouth/Throat: Mucous membranes are moist.   Neck: No stridor. Painless ROM.  Cardiovascular: Normal rate, regular rhythm. Grossly normal heart sounds.  Good peripheral circulation. Respiratory: Normal respiratory effort.  No retractions. Lungs CTAB. Gastrointestinal: Soft  and nontender. No distention. No abdominal bruits. No CVA tenderness. Genitourinary:  Musculoskeletal: No lower extremity tenderness nor edema.  No joint effusions. Neurologic:  Normal speech and language. No gross focal neurologic deficits are appreciated. No facial droop Skin:  Skin is warm, dry and intact. No rash noted. Psychiatric: Mood and affect are normal. Speech and behavior are normal.  ____________________________________________   LABS (all labs ordered are listed, but only abnormal results are displayed)  No results found for this or any previous visit (from the past 24 hour(s)). ____________________________________________  EKG My review and personal interpretation at Time: 21:11   Indication: fall with head injury  Rate: 90  Rhythm: sinus Axis: normal Other: normal  intervals, no stemi,  ____________________________________________  RADIOLOGY  I personally reviewed all radiographic images ordered to evaluate for the above acute complaints and reviewed radiology reports and findings.  These findings were personally discussed with the patient.  Please see medical record for radiology report.  ____________________________________________   PROCEDURES  Procedure(s) performed:  Procedures    Critical Care performed: no ____________________________________________   INITIAL IMPRESSION / ASSESSMENT AND PLAN / ED COURSE  Pertinent labs & imaging results that were available during my care of the patient were reviewed by me and considered in my medical decision making (see chart for details).  DDX: contusion, sah, iph, sdh,   Nina Harrison is a 84 y.o. who presents to the ED with fall and head injury as described above.  Patient is a DNR and well-appearing  pleasant and in no acute distress.  Reportedly is on aspirin therefore CT imaging will be ordered to evaluate for acute intracranial abnormality.  EKG shows no dysrhythmia and she is otherwise hematin and CT  imaging shows no evidence of C-spine fracture or head injury.  Does have soft tissue contusion.  No evidence of facial fracture.  At this point I do believe patient is stable and appropriate for discharge back to facility.      ____________________________________________   FINAL CLINICAL IMPRESSION(S) / ED DIAGNOSES  Final diagnoses:  Minor head injury, initial encounter  Contusion of other part of head, initial encounter      NEW MEDICATIONS STARTED DURING THIS VISIT:  This SmartLink is deprecated. Use AVSMEDLIST instead to display the medication list for a patient.   Note:  This document was prepared using Dragon voice recognition software and may include unintentional dictation errors.    Willy Eddyobinson, Obediah Welles, MD 05/31/17 [phone]

## 2017-05-31 NOTE — ED Notes (Signed)
Pt returned from CT °

## 2017-05-31 NOTE — ED Notes (Signed)
Report called to Fernande [facility]denom Doss[facility]

## 2017-06-03 DIAGNOSIS — B351 Tinea unguium: Secondary | ICD-10-CM

## 2017-07-15 DIAGNOSIS — G309 Alzheimer's disease, unspecified: Secondary | ICD-10-CM

## 2017-07-15 DIAGNOSIS — E039 Hypothyroidism, unspecified: Secondary | ICD-10-CM

## 2017-07-15 DIAGNOSIS — E43 Unspecified severe protein-calorie malnutrition: Secondary | ICD-10-CM | POA: Diagnosis not present

## 2017-07-15 DIAGNOSIS — G479 Sleep disorder, unspecified: Secondary | ICD-10-CM | POA: Diagnosis not present

## 2017-07-15 DIAGNOSIS — F39 Unspecified mood [affective] disorder: Secondary | ICD-10-CM | POA: Diagnosis not present

## 2017-07-15 DIAGNOSIS — I4891 Unspecified atrial fibrillation: Secondary | ICD-10-CM

## 2017-07-15 DIAGNOSIS — N183 Chronic kidney disease, stage 3 (moderate): Secondary | ICD-10-CM

## 2017-07-24 DIAGNOSIS — T7840XA Allergy, unspecified, initial encounter: Secondary | ICD-10-CM | POA: Diagnosis not present

## 2017-08-24 DEATH — deceased
